# Patient Record
Sex: Male | Born: 1935 | Race: White | Hispanic: No | Marital: Married | Smoking: Former smoker
Health system: Southern US, Community
[De-identification: ages and names within clinical notes are randomized; demographics above are authoritative.]

## PROBLEM LIST (undated history)

## (undated) DIAGNOSIS — M1990 Unspecified osteoarthritis, unspecified site: Secondary | ICD-10-CM

## (undated) DIAGNOSIS — I2510 Atherosclerotic heart disease of native coronary artery without angina pectoris: Secondary | ICD-10-CM

## (undated) DIAGNOSIS — I714 Abdominal aortic aneurysm, without rupture, unspecified: Secondary | ICD-10-CM

## (undated) DIAGNOSIS — I219 Acute myocardial infarction, unspecified: Secondary | ICD-10-CM

## (undated) DIAGNOSIS — R2243 Localized swelling, mass and lump, lower limb, bilateral: Secondary | ICD-10-CM

## (undated) DIAGNOSIS — C801 Malignant (primary) neoplasm, unspecified: Secondary | ICD-10-CM

## (undated) DIAGNOSIS — M549 Dorsalgia, unspecified: Secondary | ICD-10-CM

## (undated) DIAGNOSIS — I4891 Unspecified atrial fibrillation: Secondary | ICD-10-CM

## (undated) DIAGNOSIS — I10 Essential (primary) hypertension: Secondary | ICD-10-CM

## (undated) DIAGNOSIS — K635 Polyp of colon: Secondary | ICD-10-CM

## (undated) DIAGNOSIS — E785 Hyperlipidemia, unspecified: Secondary | ICD-10-CM

## (undated) DIAGNOSIS — K5900 Constipation, unspecified: Secondary | ICD-10-CM

## (undated) DIAGNOSIS — R58 Hemorrhage, not elsewhere classified: Secondary | ICD-10-CM

## (undated) DIAGNOSIS — G8929 Other chronic pain: Secondary | ICD-10-CM

## (undated) DIAGNOSIS — G4730 Sleep apnea, unspecified: Secondary | ICD-10-CM

## (undated) HISTORY — PX: CARDIAC SURGERY: SHX584

## (undated) HISTORY — PX: IRRIGATION AND DEBRIDEMENT HEMATOMA: SHX5254

## (undated) HISTORY — PX: HEMORRHOID SURGERY: SHX153

## (undated) SURGICAL SUPPLY — 1 items: NDL ENTRY 21GA 7CM ECHOTIP (NEEDLE) IMPLANT

---

## 2006-01-30 ENCOUNTER — Other Ambulatory Visit: Payer: Self-pay

## 2006-01-30 ENCOUNTER — Emergency Department: Payer: Self-pay | Admitting: Internal Medicine

## 2006-04-12 ENCOUNTER — Other Ambulatory Visit: Payer: Self-pay

## 2006-04-12 ENCOUNTER — Emergency Department: Payer: Self-pay | Admitting: Emergency Medicine

## 2008-11-02 ENCOUNTER — Emergency Department: Payer: Self-pay | Admitting: Emergency Medicine

## 2010-03-25 ENCOUNTER — Ambulatory Visit: Payer: Self-pay | Admitting: Internal Medicine

## 2010-05-21 ENCOUNTER — Ambulatory Visit: Payer: Self-pay | Admitting: Internal Medicine

## 2010-06-07 ENCOUNTER — Ambulatory Visit: Payer: Self-pay | Admitting: Unknown Physician Specialty

## 2010-06-08 LAB — PATHOLOGY REPORT

## 2012-09-28 ENCOUNTER — Ambulatory Visit: Payer: Self-pay | Admitting: Unknown Physician Specialty

## 2013-03-17 ENCOUNTER — Ambulatory Visit: Payer: Self-pay | Admitting: Cardiology

## 2013-03-17 LAB — PROTIME-INR: Prothrombin Time: 14.7 secs (ref 11.5–14.7)

## 2014-02-24 ENCOUNTER — Ambulatory Visit: Payer: Self-pay

## 2014-03-01 ENCOUNTER — Ambulatory Visit: Payer: Self-pay

## 2014-03-10 DIAGNOSIS — I4891 Unspecified atrial fibrillation: Secondary | ICD-10-CM | POA: Insufficient documentation

## 2014-07-15 DIAGNOSIS — Z9889 Other specified postprocedural states: Secondary | ICD-10-CM | POA: Insufficient documentation

## 2014-10-19 ENCOUNTER — Observation Stay: Payer: Self-pay | Admitting: Internal Medicine

## 2014-10-19 IMAGING — CT CT EXTREM LOW W/O CM*L*
2 of 5 series · 12 of 46 positions shown, 14 images · non-contrast
Comparison: None

CLINICAL DATA: LEFT leg pain following a 7 hr car ride, no trauma,
pain with bearing weight, swelling in thigh

EXAM:
CT OF THE LOWER LEFT EXTREMITY WITHOUT CONTRAST
TECHNIQUE: Multidetector CT imaging of the LEFT hip and femur was performed
according to the standard protocol. Sagittal and coronal MPR images
reconstructed from axial data set.

[Series 5: soft tissue axials · axial · 0.49mm/px · z∈[-660,-224]mm · 9 of 274 slices shown, 11 images]
[im 28/274  soft-tissue]
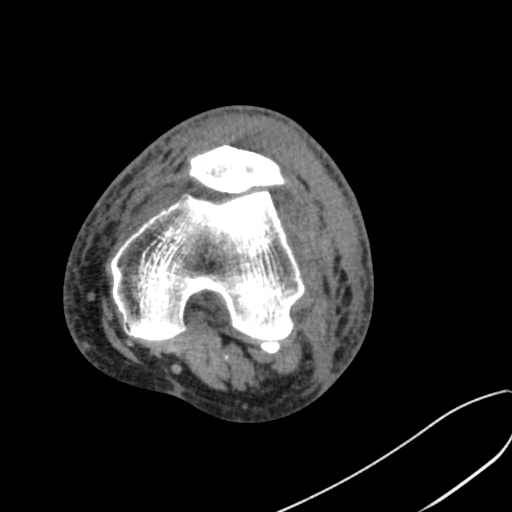
[im 28/274  bone]
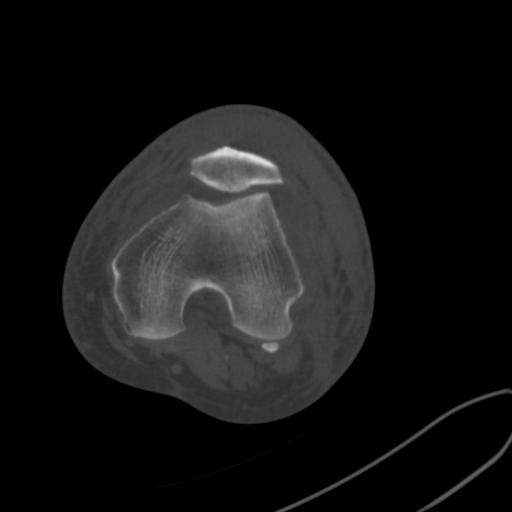
[im 55/274  soft-tissue]
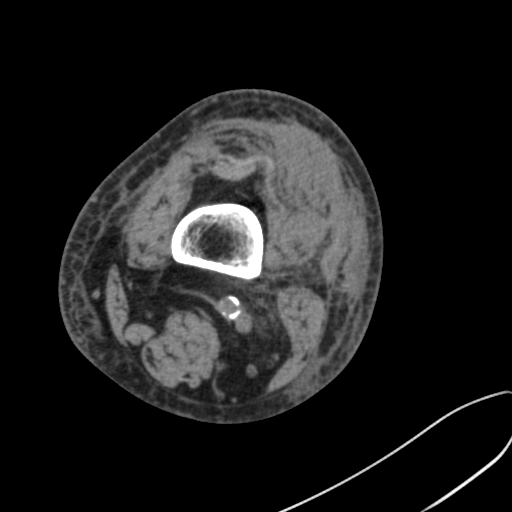
[im 82/274  soft-tissue]
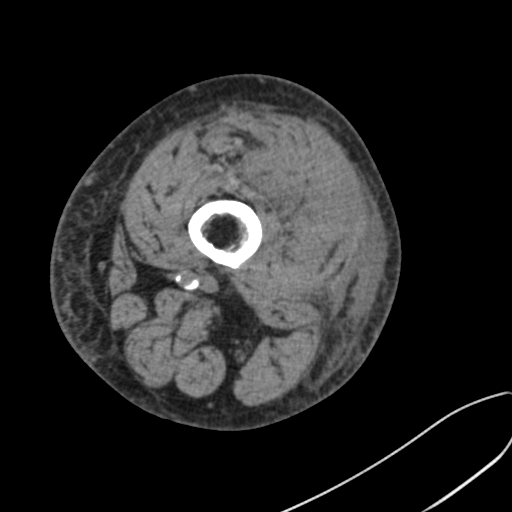
[im 110/274  soft-tissue]
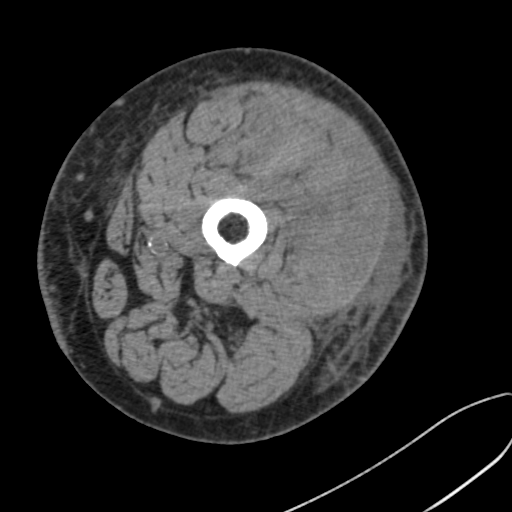
[im 137/274  soft-tissue]
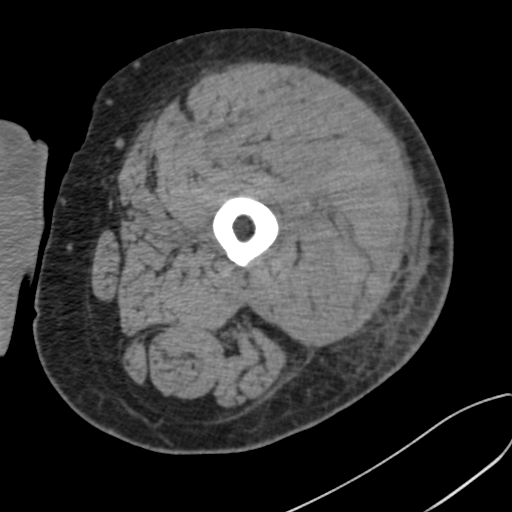
[im 164/274  soft-tissue]
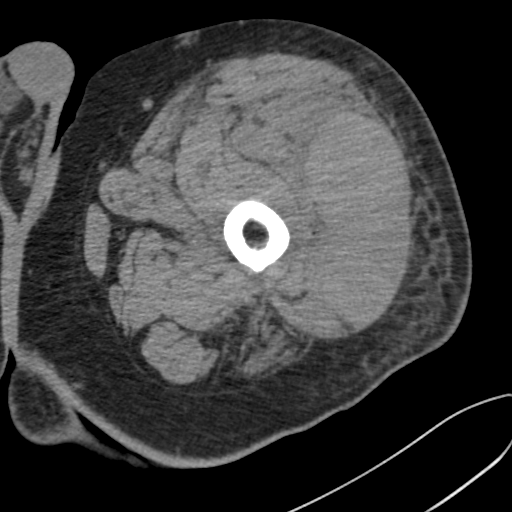
[im 192/274  soft-tissue]
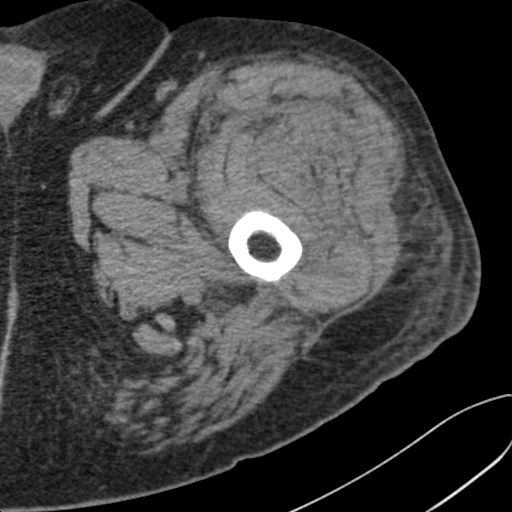
[im 219/274  soft-tissue]
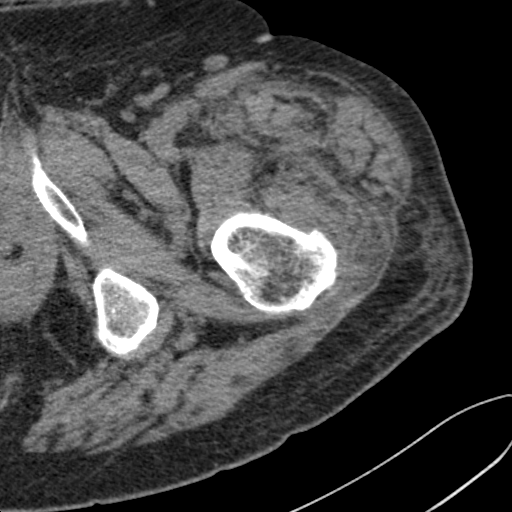
[im 246/274  soft-tissue]
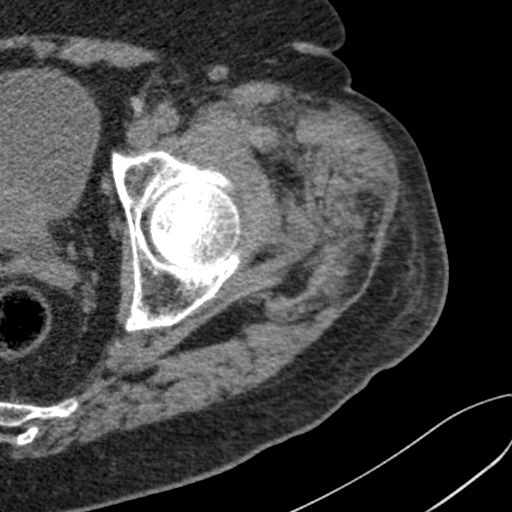
[im 246/274  bone]
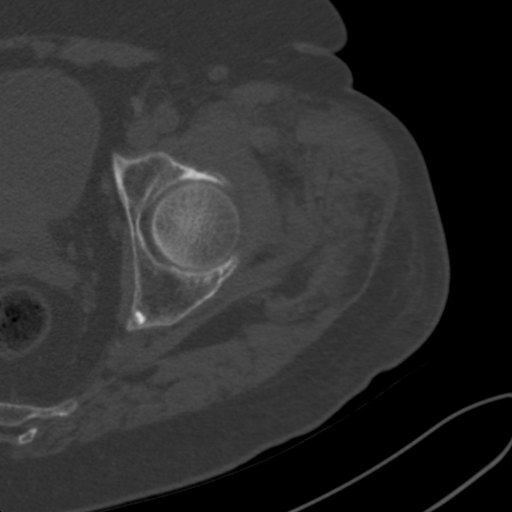

[Series 7: coronal soft tissue · coronal · 0.46mm/px · 3 of 118 slices shown]
[im 40/118  soft-tissue]
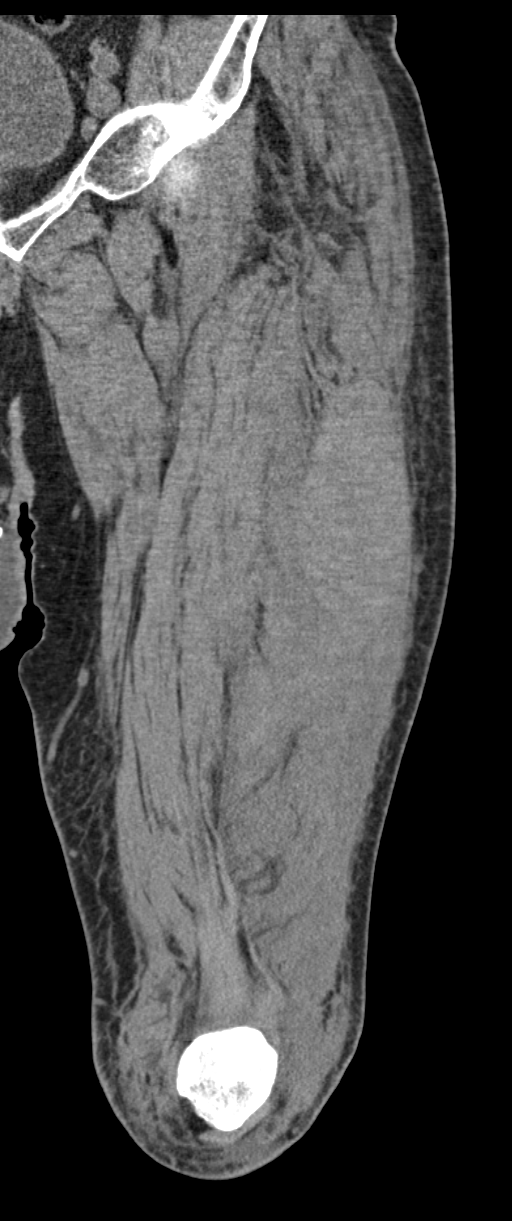
[im 53/118  soft-tissue]
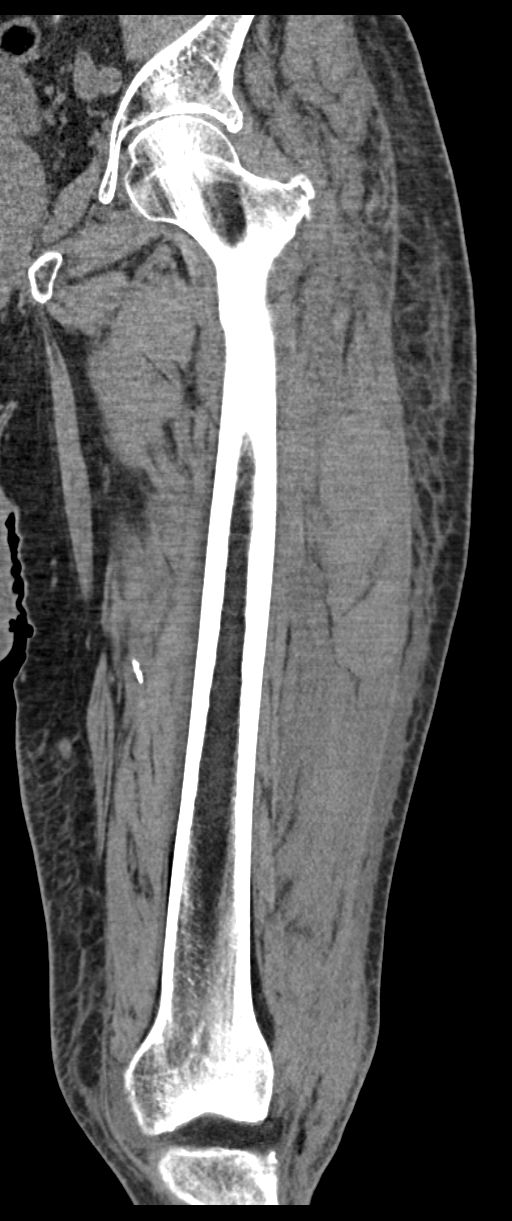
[im 66/118  soft-tissue]
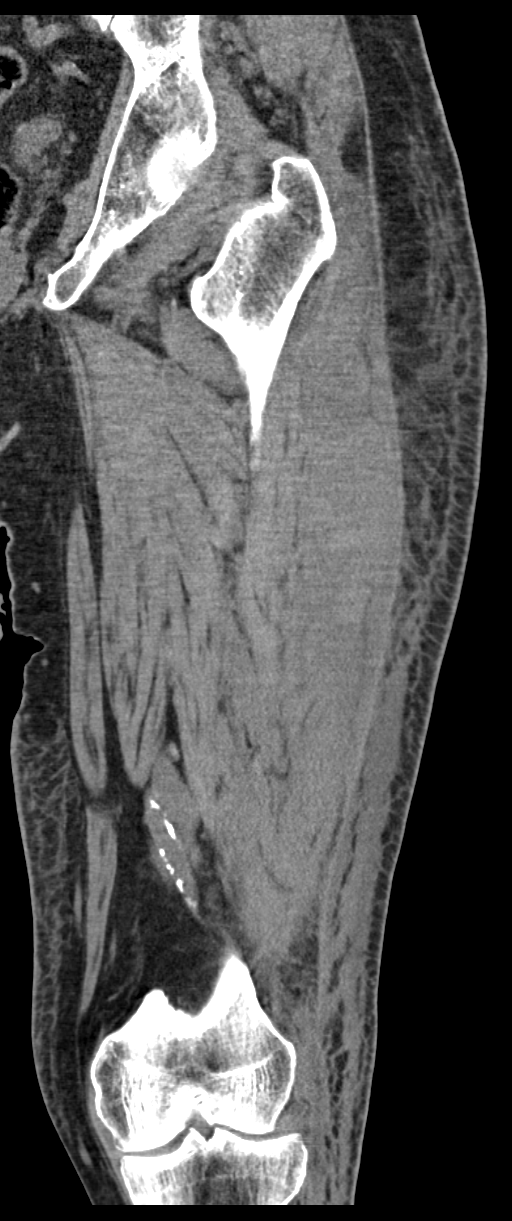

[12 of 46 positions shown; findings below may reference images not displayed]

FINDINGS: Bones appear demineralized.

Narrowing of LEFT hip joint.

Mild narrowing of knee joint spaces.

No fracture, dislocation, or bone destruction.

Extensive atherosclerotic calcifications.

High attenuation collection identified within the lateral aspect of
the LEFT quadriceps muscle, 11.3 x 4.7 x 13.6 cm in size compatible
with a large intramuscular hematoma.

Displacement of muscle bundles medially.

Scattered infiltration of intermuscular tissue planes as well as
probable additional less well defined hemorrhage extending down the
LEFT quadriceps muscle, associated with overall enlargement of the
LEFT quadriceps.

No additional mass or abnormal fluid collections.
IMPRESSION: Large intramuscular hematoma at the lateral aspect of the mid LEFT
quadriceps, 11.3 x 4.7 x 13.6 cm in size with associated
infiltration of inter and intra muscular planes within the
quadriceps, quadriceps enlargement, and overlying soft tissue edema.

Etiology of hemorrhage is uncertain; this could be related to
nonspecific trauma though underlying mass is not entirely excluded.

Clinical followup until resolution recommended at, and if there is
clinical suspicion of persistent mass consider MR follow-up.

No acute osseous abnormalities.

Mild degenerative changes of the LEFT hip and LEFT knee joints.

## 2014-10-20 LAB — BASIC METABOLIC PANEL
Anion Gap: 5 — ABNORMAL LOW (ref 7–16)
BUN: 12 mg/dL (ref 7–18)
CALCIUM: 8.6 mg/dL (ref 8.5–10.1)
CO2: 29 mmol/L (ref 21–32)
CREATININE: 0.73 mg/dL (ref 0.60–1.30)
Chloride: 107 mmol/L (ref 98–107)
EGFR (African American): >60
EGFR (Non-African Amer.): >60
Glucose: 98 mg/dL (ref 65–99)
Osmolality: 281 (ref 275–301)
Potassium: 3.9 mmol/L (ref 3.5–5.1)
Sodium: 141 mmol/L (ref 136–145)

## 2014-10-20 LAB — CBC WITH DIFFERENTIAL/PLATELET
BASOS PCT: 0.5 %
Basophil #: 0 10*3/uL (ref 0.0–0.1)
Eosinophil #: 0.3 10*3/uL (ref 0.0–0.7)
Eosinophil %: 3.7 %
HCT: 31.8 % — ABNORMAL LOW (ref 40.0–52.0)
HGB: 10.7 g/dL — AB (ref 13.0–18.0)
Lymphocyte #: 2 10*3/uL (ref 1.0–3.6)
Lymphocyte %: 21.8 %
MCH: 33.2 pg (ref 26.0–34.0)
MCHC: 33.6 g/dL (ref 32.0–36.0)
MCV: 99 fL (ref 80–100)
Monocyte #: 1 x10 3/mm (ref 0.2–1.0)
Monocyte %: 11.1 %
NEUTROS ABS: 5.8 10*3/uL (ref 1.4–6.5)
Neutrophil %: 62.9 %
Platelet: 224 10*3/uL (ref 150–440)
RBC: 3.22 10*6/uL — ABNORMAL LOW (ref 4.40–5.90)
RDW: 13.5 % (ref 11.5–14.5)
WBC: 9.2 10*3/uL (ref 3.8–10.6)

## 2014-10-20 LAB — PROTIME-INR
INR: 2.8
Prothrombin Time: 28.8 secs — ABNORMAL HIGH (ref 11.5–14.7)

## 2014-10-20 LAB — CK: CK, Total: 44 U/L (ref 39–308)

## 2014-10-21 LAB — CBC WITH DIFFERENTIAL/PLATELET
Basophil #: 0.1 10*3/uL (ref 0.0–0.1)
Basophil %: 0.5 %
EOS ABS: 0.3 10*3/uL (ref 0.0–0.7)
EOS PCT: 3.1 %
HCT: 29 % — AB (ref 40.0–52.0)
HGB: 9.6 g/dL — AB (ref 13.0–18.0)
LYMPHS PCT: 18.5 %
Lymphocyte #: 1.9 10*3/uL (ref 1.0–3.6)
MCH: 33 pg (ref 26.0–34.0)
MCHC: 33.2 g/dL (ref 32.0–36.0)
MCV: 100 fL (ref 80–100)
Monocyte #: 1.1 x10 3/mm — ABNORMAL HIGH (ref 0.2–1.0)
Monocyte %: 10.2 %
Neutrophil #: 7 10*3/uL — ABNORMAL HIGH (ref 1.4–6.5)
Neutrophil %: 67.7 %
Platelet: 221 10*3/uL (ref 150–440)
RBC: 2.92 10*6/uL — ABNORMAL LOW (ref 4.40–5.90)
RDW: 13.2 % (ref 11.5–14.5)
WBC: 10.3 10*3/uL (ref 3.8–10.6)

## 2014-10-21 LAB — BASIC METABOLIC PANEL
Anion Gap: 5 — ABNORMAL LOW (ref 7–16)
BUN: 13 mg/dL (ref 7–18)
CO2: 28 mmol/L (ref 21–32)
CREATININE: 0.64 mg/dL (ref 0.60–1.30)
Calcium, Total: 8.3 mg/dL — ABNORMAL LOW (ref 8.5–10.1)
Chloride: 107 mmol/L (ref 98–107)
EGFR (Non-African Amer.): >60
Glucose: 101 mg/dL — ABNORMAL HIGH (ref 65–99)
Osmolality: 280 (ref 275–301)
Potassium: 3.7 mmol/L (ref 3.5–5.1)
Sodium: 140 mmol/L (ref 136–145)

## 2014-11-07 DIAGNOSIS — S8010XA Contusion of unspecified lower leg, initial encounter: Secondary | ICD-10-CM | POA: Insufficient documentation

## 2014-11-08 ENCOUNTER — Encounter: Payer: Self-pay | Admitting: General Surgery

## 2014-11-11 ENCOUNTER — Encounter: Payer: Self-pay | Admitting: General Surgery

## 2014-11-29 ENCOUNTER — Encounter: Payer: Self-pay | Admitting: Surgery

## 2014-12-12 ENCOUNTER — Encounter: Payer: Self-pay | Admitting: Surgery

## 2015-01-10 ENCOUNTER — Encounter
Admit: 2015-01-10 | Disposition: A | Discharge status: Home / Self Care | Payer: Self-pay | Attending: Surgery | Admitting: Surgery

## 2015-02-09 DIAGNOSIS — I10 Essential (primary) hypertension: Secondary | ICD-10-CM | POA: Insufficient documentation

## 2015-02-09 DIAGNOSIS — E785 Hyperlipidemia, unspecified: Secondary | ICD-10-CM | POA: Insufficient documentation

## 2015-02-09 DIAGNOSIS — I714 Abdominal aortic aneurysm, without rupture, unspecified: Secondary | ICD-10-CM | POA: Insufficient documentation

## 2015-02-09 DIAGNOSIS — I2510 Atherosclerotic heart disease of native coronary artery without angina pectoris: Secondary | ICD-10-CM | POA: Insufficient documentation

## 2015-02-09 DIAGNOSIS — G4730 Sleep apnea, unspecified: Secondary | ICD-10-CM | POA: Insufficient documentation

## 2015-03-04 NOTE — Consult Note (Signed)
PATIENT NAME:  Richard Wilkins, NUNO MR#:  [id number] DATE OF BIRTH:  1935-01-19  DATE OF CONSULTATION:  10/20/2014  ATTENDING PHYSICIAN:  Adin Hector, MD  CONSULTING PHYSICIAN:  Laurene Footman, MD  REASON FOR CONSULTATION:  Left thigh hematoma.   HISTORY OF PRESENT ILLNESS:  The patient is a 80 year old who did not really have a  history of trauma. He recently underwent a long trip driving a car to [place] and back and subsequent to this developed a large amount of swelling in the thigh, a great deal of pain, and had inability to ambulate. He was seen by Dr. Caryl Comes, his regular physician, yesterday. He had a CT scan that showed extensive clot along the lateral thigh, extending into the vastus lateralis. He is additionally on Coumadin related to prior stents, as well as atrial fibrillation.   PHYSICAL EXAMINATION:  On comparison of the left thigh to the right, the left thigh is much larger. It is somewhat firm, but not tense. No evidence of compartment syndrome. There is firm swelling to the lateral aspect of the thigh approximately from the greater trochanter to just above the knee with mild effusion of the knee. He has moderate pain with range of motion. No pain with hip rolling. He is neurovascularly intact otherwise.   IMPRESSION:  Based on CT and exam is a very large hematoma.   RECOMMENDATIONS:  For incision and drainage of the hematoma. I suspect that he will additionally need a wound VAC. He will probably need this for 1 to 3 weeks to get the wound to entirely heal without residual hematoma that could be quite bothersome. The risks, benefits, and possible complications were discussed with this patient and surgery scheduled today and performed.    ____________________________ Laurene Footman, MD mjm:nb D: 10/20/2014 18:39:52 ET T: 10/20/2014 23:53:40 ET JOB#: [id number]  cc: Laurene Footman, MD, <Dictator> Laurene Footman MD ELECTRONICALLY SIGNED 10/21/2014 7:10

## 2015-03-04 NOTE — Consult Note (Signed)
Brief Consult Note: Diagnosis: left vastus lateralis hematoma.   Patient was seen by consultant.   Recommend to proceed with surgery or procedure.   Comments: paln drainage of hematoma this am with wound vac application.  Electronic Signatures: Laurene Footman (MD)  (Signed 10-Dec-15 07:16)  Authored: Brief Consult Note   Last Updated: 10-Dec-15 07:16 by Laurene Footman (MD)

## 2015-03-04 NOTE — Op Note (Signed)
PATIENT NAME:  Richard Wilkins, Richard Wilkins MR#:  [id number] DATE OF BIRTH:  10-13-1935  DATE OF PROCEDURE:  10/20/2014  PREOPERATIVE DIAGNOSIS: Left thigh intramuscular hematoma.  POSTOPERATIVE DIAGNOSIS:  Left thigh intramuscular hematoma.  PROCEDURE: Incision and drainage of hematoma, application of wound VAC.   ANESTHESIA: Local with MAC.  SURGEON: Laurene Footman, MD  DESCRIPTION OF PROCEDURE: Patient was brought to the operating room, and after appropriate patient identification and timeout procedure were carried out, the skin was prepped with alcohol and 30 mL of 0.5% Sensorcaine was infiltrated in the area of the planned incision. The hip was then prepped and draped in the usual sterile fashion and a repeat timeout procedure completed. With adequate sedation having been given, the approximately 5 cm incision was made over the lateral thigh proximal to the mid thigh. The subcutaneous tissue was spread and there was a small amount of hematoma subcutaneously. After opening the IT band, there was a large hematoma with extensive clot present. This was removed with the use of a Kelly clamp and then the pulse irrigator was used to flush out the lateral thigh, removing clot adjacent to the incision. Pressing on the thigh, additional hematoma could be removed. Next, a wound VAC was applied with foam placed in the wound. The wound measured approximately 5 cm in length, 2 cm in width, 4 cm deep. The wound VAC then had suction applied. There was no leak, and this appeared to be functioning well. The patient was then sent to the recovery room in stable condition.   ESTIMATED BLOOD LOSS: Approximately 50 mL from the surgery, with a large amount of removed that was not acute blood loss.   CONDITION: To recovery room, stable.  SPECIMEN: There is no specimen.   COMPLICATIONS: No complications.   ____________________________ Laurene Footman, MD mjm:ST D: 10/20/2014 18:37:56 ET T: 10/21/2014 04:18:39  ET JOB#: [id number]  cc: Laurene Footman, MD, <Dictator> Laurene Footman MD ELECTRONICALLY SIGNED 10/21/2014 7:10

## 2015-03-04 NOTE — H&P (Signed)
PATIENT NAME:  Richard Wilkins, Richard Wilkins MR#:  [id number] DATE OF BIRTH:  10-26-1935  DATE OF ADMISSION:  10/19/2014  OBSERVATION ADMISSION NOTE  CHIEF COMPLAINT: Left leg pain.   HISTORY OF PRESENT ILLNESS: A 80 year old male with history of coronary artery disease, hypertension, hyperlipidemia, atrial fibrillation, sleep apnea, who has been on Coumadin therapy, took a trip down to [place], about a 7 hour ride, for his brother-in-law's funeral. He developed pain in his left leg during that trip, which was worse when he bore weight on it. Once he rested and left it elevated, it was slightly better, but still painful. It was also swelling, especially in the thigh. Upon traveling back, he noticed this was much more severe, and today he is not able to bear weight on it. He describes a tearing sensation in his thigh when he tries to stand on it. He has not had chest pain, shortness of breath, fevers, chills. He gets bruising with his Coumadin, but had not noticed any significant bruising on the leg. He denies any fall, trauma.   On examination, his left thigh is noticeably larger than his right, with areas of firmness in the medial and lateral aspect of the thigh, which are tender to palpation, especially over the lateral aspect. He is admitted now with severe left leg pain and inability to ambulate. Possibilities include clot, but more likely hematoma in the thigh.   PAST MEDICAL HISTORY:  1.  Coronary artery disease with prior stenting of the RCA, followed by Dr. Isaias Cowman.  2.  Hypertension.  3.  Hyperlipidemia.  4.  History of tachybrady syndrome, with atrial fibrillation on chronic Coumadin therapy.  5.  History of sleep apnea.   ALLERGIES: No known drug allergies.   MEDICATIONS:  1.  Aspirin 81 mg p.o. daily.  2.  Buspirone 15 mg p.o. at bedtime, p.r.n. anxiety.  3.  Cardizem CD 180 mg p.o. daily.  4.  Flonase 2 sprays each nostril daily.  5.  Simvastatin 40 mg p.o. at bedtime.  6.   Tamsulosin 0.4 mg p.o. at bedtime.   SOCIAL HISTORY: Remote tobacco. No alcohol.   FAMILY HISTORY: Parkinson's.   REVIEW OF SYSTEMS: Please see HPI. No vision changes; no dysphagia; no rash. Some edema in the ankle on the left side since the leg has been more painful. The remainder of a complete review of systems is negative.   PHYSICAL EXAMINATION:  VITAL SIGNS: Weight 246, pulse 99, saturation 98% on room air. Blood pressure 120/70. BMI 36.3.  GENERAL: Obese male, in some discomfort.  EYES: Pupils round, react to light. Lids and conjunctivae unremarkable.  EARS, NOSE AND THROAT: External examination unremarkable. The oropharynx is moist without lesions.  NECK: Supple. Trachea midline. No thyromegaly.  CARDIOVASCULAR: Irregularly irregular, without murmurs, gallops or rubs.  LUNGS: Clear bilaterally, without wheeze or retractions.  ABDOMEN: Soft, nontender, positive bowel sounds. No guarding or rebound.  EXTREMITIES: Significant edema of the left thigh compared to the right, with firmness. Tenderness to palpation, without significant bruising. Old bruising is seen over the knee and the posterior thigh. Trace ankle edema. Negative Homan. Minimal edema in the right ankle.  NEUROLOGIC: Cranial nerves are intact. Unable to bear weight on the left leg. He can move this leg, but gets pain to movement of the thigh muscles.  SKIN: Diffuse bruising.  LYMPHATIC: No lymphadenopathy. No cervical or supraclavicular nodes.   IMPRESSION AND PLAN:  1.  Left leg pain and swelling. Prothrombin time, CBC, MET-C were obtained  and sent STAT in the office. Due to the inability to ambulate and the need for imaging, we will bring him in under observation status. We will start with Doppler of the leg to exclude clot; however, if this is not seen, particularly if his INR is in the normal range, we will likely need to move to CT scan to evaluate for bleeding. Aneurysmal change would be still possible, though would seem  less likely as it he does appear to have palpable pulses down into the ankle. Pain control.  2. Hypertension. Continue home blood pressure medications and follow.  3.  Atrial fibrillation. Hold the Coumadin until we see what his INR looks like and rule out bleed. Continue Cardizem, as he has tolerated this.  4.  Coronary artery disease. Holding aspirin for the same reason as holding the Coumadin. He has tolerated this dose of simvastatin and Cardizem, and we will continue it for now.    ____________________________ Adin Hector, MD bjk:MT D: 10/19/2014 17:02:27 ET T: 10/19/2014 17:16:30 ET JOB#: [id number]  cc: Adin Hector, MD, <Dictator> Ramonita Lab MD ELECTRONICALLY SIGNED 10/24/2014 8:35

## 2015-12-09 DIAGNOSIS — G4733 Obstructive sleep apnea (adult) (pediatric): Secondary | ICD-10-CM | POA: Diagnosis not present

## 2015-12-11 DIAGNOSIS — I4891 Unspecified atrial fibrillation: Secondary | ICD-10-CM | POA: Diagnosis not present

## 2016-01-05 DIAGNOSIS — I10 Essential (primary) hypertension: Secondary | ICD-10-CM | POA: Diagnosis not present

## 2016-01-05 DIAGNOSIS — I714 Abdominal aortic aneurysm, without rupture: Secondary | ICD-10-CM | POA: Diagnosis not present

## 2016-01-05 DIAGNOSIS — I499 Cardiac arrhythmia, unspecified: Secondary | ICD-10-CM | POA: Diagnosis not present

## 2016-01-05 DIAGNOSIS — I70235 Atherosclerosis of native arteries of right leg with ulceration of other part of foot: Secondary | ICD-10-CM | POA: Diagnosis not present

## 2016-01-15 DIAGNOSIS — I4891 Unspecified atrial fibrillation: Secondary | ICD-10-CM | POA: Diagnosis not present

## 2016-01-26 DIAGNOSIS — D2262 Melanocytic nevi of left upper limb, including shoulder: Secondary | ICD-10-CM | POA: Diagnosis not present

## 2016-01-26 DIAGNOSIS — Z85828 Personal history of other malignant neoplasm of skin: Secondary | ICD-10-CM | POA: Diagnosis not present

## 2016-01-26 DIAGNOSIS — L0889 Other specified local infections of the skin and subcutaneous tissue: Secondary | ICD-10-CM | POA: Diagnosis not present

## 2016-01-26 DIAGNOSIS — B078 Other viral warts: Secondary | ICD-10-CM | POA: Diagnosis not present

## 2016-01-26 DIAGNOSIS — D225 Melanocytic nevi of trunk: Secondary | ICD-10-CM | POA: Diagnosis not present

## 2016-01-26 DIAGNOSIS — D2271 Melanocytic nevi of right lower limb, including hip: Secondary | ICD-10-CM | POA: Diagnosis not present

## 2016-01-26 DIAGNOSIS — L538 Other specified erythematous conditions: Secondary | ICD-10-CM | POA: Diagnosis not present

## 2016-01-29 DIAGNOSIS — I480 Paroxysmal atrial fibrillation: Secondary | ICD-10-CM | POA: Diagnosis not present

## 2016-01-29 DIAGNOSIS — Z9889 Other specified postprocedural states: Secondary | ICD-10-CM | POA: Diagnosis not present

## 2016-01-29 DIAGNOSIS — I2510 Atherosclerotic heart disease of native coronary artery without angina pectoris: Secondary | ICD-10-CM | POA: Diagnosis not present

## 2016-01-29 DIAGNOSIS — G4733 Obstructive sleep apnea (adult) (pediatric): Secondary | ICD-10-CM | POA: Diagnosis not present

## 2016-01-29 DIAGNOSIS — I714 Abdominal aortic aneurysm, without rupture: Secondary | ICD-10-CM | POA: Diagnosis not present

## 2016-01-29 DIAGNOSIS — E7800 Pure hypercholesterolemia, unspecified: Secondary | ICD-10-CM | POA: Diagnosis not present

## 2016-01-29 DIAGNOSIS — I10 Essential (primary) hypertension: Secondary | ICD-10-CM | POA: Diagnosis not present

## 2016-02-12 DIAGNOSIS — I4891 Unspecified atrial fibrillation: Secondary | ICD-10-CM | POA: Diagnosis not present

## 2016-02-26 DIAGNOSIS — G4733 Obstructive sleep apnea (adult) (pediatric): Secondary | ICD-10-CM | POA: Diagnosis not present

## 2016-03-13 DIAGNOSIS — I4891 Unspecified atrial fibrillation: Secondary | ICD-10-CM | POA: Diagnosis not present

## 2016-04-10 DIAGNOSIS — I10 Essential (primary) hypertension: Secondary | ICD-10-CM | POA: Diagnosis not present

## 2016-04-10 DIAGNOSIS — I2510 Atherosclerotic heart disease of native coronary artery without angina pectoris: Secondary | ICD-10-CM | POA: Diagnosis not present

## 2016-04-10 DIAGNOSIS — I4891 Unspecified atrial fibrillation: Secondary | ICD-10-CM | POA: Diagnosis not present

## 2016-04-17 DIAGNOSIS — I10 Essential (primary) hypertension: Secondary | ICD-10-CM | POA: Diagnosis not present

## 2016-04-17 DIAGNOSIS — D692 Other nonthrombocytopenic purpura: Secondary | ICD-10-CM | POA: Insufficient documentation

## 2016-04-17 DIAGNOSIS — E784 Other hyperlipidemia: Secondary | ICD-10-CM | POA: Diagnosis not present

## 2016-04-17 DIAGNOSIS — I480 Paroxysmal atrial fibrillation: Secondary | ICD-10-CM | POA: Diagnosis not present

## 2016-04-17 DIAGNOSIS — I2510 Atherosclerotic heart disease of native coronary artery without angina pectoris: Secondary | ICD-10-CM | POA: Diagnosis not present

## 2016-04-17 DIAGNOSIS — G4733 Obstructive sleep apnea (adult) (pediatric): Secondary | ICD-10-CM | POA: Diagnosis not present

## 2016-04-17 DIAGNOSIS — I714 Abdominal aortic aneurysm, without rupture: Secondary | ICD-10-CM | POA: Diagnosis not present

## 2016-05-08 DIAGNOSIS — I4891 Unspecified atrial fibrillation: Secondary | ICD-10-CM | POA: Diagnosis not present

## 2016-05-29 DIAGNOSIS — R208 Other disturbances of skin sensation: Secondary | ICD-10-CM | POA: Diagnosis not present

## 2016-05-29 DIAGNOSIS — G4733 Obstructive sleep apnea (adult) (pediatric): Secondary | ICD-10-CM | POA: Diagnosis not present

## 2016-05-29 DIAGNOSIS — D485 Neoplasm of uncertain behavior of skin: Secondary | ICD-10-CM | POA: Diagnosis not present

## 2016-05-29 DIAGNOSIS — L821 Other seborrheic keratosis: Secondary | ICD-10-CM | POA: Diagnosis not present

## 2016-06-05 DIAGNOSIS — I4891 Unspecified atrial fibrillation: Secondary | ICD-10-CM | POA: Diagnosis not present

## 2016-06-12 DIAGNOSIS — M722 Plantar fascial fibromatosis: Secondary | ICD-10-CM | POA: Diagnosis not present

## 2016-06-26 DIAGNOSIS — M79672 Pain in left foot: Secondary | ICD-10-CM | POA: Diagnosis not present

## 2016-06-26 DIAGNOSIS — M722 Plantar fascial fibromatosis: Secondary | ICD-10-CM | POA: Diagnosis not present

## 2016-07-03 DIAGNOSIS — I4891 Unspecified atrial fibrillation: Secondary | ICD-10-CM | POA: Diagnosis not present

## 2016-07-05 DIAGNOSIS — I499 Cardiac arrhythmia, unspecified: Secondary | ICD-10-CM | POA: Diagnosis not present

## 2016-07-05 DIAGNOSIS — I10 Essential (primary) hypertension: Secondary | ICD-10-CM | POA: Diagnosis not present

## 2016-07-05 DIAGNOSIS — I714 Abdominal aortic aneurysm, without rupture: Secondary | ICD-10-CM | POA: Diagnosis not present

## 2016-07-05 DIAGNOSIS — I70235 Atherosclerosis of native arteries of right leg with ulceration of other part of foot: Secondary | ICD-10-CM | POA: Diagnosis not present

## 2016-07-24 DIAGNOSIS — M79672 Pain in left foot: Secondary | ICD-10-CM | POA: Diagnosis not present

## 2016-07-24 DIAGNOSIS — M722 Plantar fascial fibromatosis: Secondary | ICD-10-CM | POA: Diagnosis not present

## 2016-08-01 DIAGNOSIS — E7800 Pure hypercholesterolemia, unspecified: Secondary | ICD-10-CM | POA: Diagnosis not present

## 2016-08-01 DIAGNOSIS — I10 Essential (primary) hypertension: Secondary | ICD-10-CM | POA: Diagnosis not present

## 2016-08-01 DIAGNOSIS — G4733 Obstructive sleep apnea (adult) (pediatric): Secondary | ICD-10-CM | POA: Diagnosis not present

## 2016-08-01 DIAGNOSIS — R0602 Shortness of breath: Secondary | ICD-10-CM | POA: Diagnosis not present

## 2016-08-01 DIAGNOSIS — Z9889 Other specified postprocedural states: Secondary | ICD-10-CM | POA: Diagnosis not present

## 2016-08-01 DIAGNOSIS — I4891 Unspecified atrial fibrillation: Secondary | ICD-10-CM | POA: Diagnosis not present

## 2016-08-01 DIAGNOSIS — I480 Paroxysmal atrial fibrillation: Secondary | ICD-10-CM | POA: Diagnosis not present

## 2016-08-08 ENCOUNTER — Emergency Department: Payer: PPO

## 2016-08-08 ENCOUNTER — Encounter: Payer: Self-pay | Admitting: Emergency Medicine

## 2016-08-08 ENCOUNTER — Observation Stay
Admission: EM | Admit: 2016-08-08 | Discharge: 2016-08-09 | Disposition: A | Discharge status: Home / Self Care | Payer: PPO | Attending: Internal Medicine | Admitting: Internal Medicine

## 2016-08-08 DIAGNOSIS — Z87891 Personal history of nicotine dependence: Secondary | ICD-10-CM | POA: Diagnosis not present

## 2016-08-08 DIAGNOSIS — J449 Chronic obstructive pulmonary disease, unspecified: Secondary | ICD-10-CM | POA: Diagnosis not present

## 2016-08-08 DIAGNOSIS — Z7982 Long term (current) use of aspirin: Secondary | ICD-10-CM | POA: Diagnosis not present

## 2016-08-08 DIAGNOSIS — I2510 Atherosclerotic heart disease of native coronary artery without angina pectoris: Secondary | ICD-10-CM | POA: Diagnosis not present

## 2016-08-08 DIAGNOSIS — I10 Essential (primary) hypertension: Secondary | ICD-10-CM | POA: Diagnosis not present

## 2016-08-08 DIAGNOSIS — M549 Dorsalgia, unspecified: Secondary | ICD-10-CM | POA: Diagnosis not present

## 2016-08-08 DIAGNOSIS — Q438 Other specified congenital malformations of intestine: Secondary | ICD-10-CM | POA: Insufficient documentation

## 2016-08-08 DIAGNOSIS — K5900 Constipation, unspecified: Principal | ICD-10-CM | POA: Diagnosis present

## 2016-08-08 DIAGNOSIS — E785 Hyperlipidemia, unspecified: Secondary | ICD-10-CM | POA: Insufficient documentation

## 2016-08-08 DIAGNOSIS — Z7901 Long term (current) use of anticoagulants: Secondary | ICD-10-CM | POA: Insufficient documentation

## 2016-08-08 DIAGNOSIS — G8929 Other chronic pain: Secondary | ICD-10-CM | POA: Diagnosis not present

## 2016-08-08 DIAGNOSIS — I714 Abdominal aortic aneurysm, without rupture: Secondary | ICD-10-CM | POA: Diagnosis not present

## 2016-08-08 DIAGNOSIS — I482 Chronic atrial fibrillation: Secondary | ICD-10-CM | POA: Diagnosis not present

## 2016-08-08 HISTORY — DX: Atherosclerotic heart disease of native coronary artery without angina pectoris: I25.10

## 2016-08-08 HISTORY — DX: Hyperlipidemia, unspecified: E78.5

## 2016-08-08 HISTORY — DX: Essential (primary) hypertension: I10

## 2016-08-08 HISTORY — DX: Sleep apnea, unspecified: G47.30

## 2016-08-08 HISTORY — DX: Polyp of colon: K63.5

## 2016-08-08 HISTORY — DX: Abdominal aortic aneurysm, without rupture: I71.4

## 2016-08-08 HISTORY — DX: Other chronic pain: G89.29

## 2016-08-08 HISTORY — DX: Malignant (primary) neoplasm, unspecified: C80.1

## 2016-08-08 HISTORY — DX: Abdominal aortic aneurysm, without rupture, unspecified: I71.40

## 2016-08-08 HISTORY — DX: Hemorrhage, not elsewhere classified: R58

## 2016-08-08 HISTORY — DX: Unspecified atrial fibrillation: I48.91

## 2016-08-08 HISTORY — DX: Dorsalgia, unspecified: M54.9

## 2016-08-08 LAB — URINALYSIS COMPLETE WITH MICROSCOPIC (ARMC ONLY)
BACTERIA UA: NONE SEEN
Bilirubin Urine: NEGATIVE
Glucose, UA: NEGATIVE mg/dL
Hgb urine dipstick: NEGATIVE
Ketones, ur: NEGATIVE mg/dL
Nitrite: NEGATIVE
PROTEIN: NEGATIVE mg/dL
Specific Gravity, Urine: 1.017 (ref 1.005–1.030)
pH: 6 (ref 5.0–8.0)

## 2016-08-08 LAB — COMPREHENSIVE METABOLIC PANEL
ALT: 20 U/L (ref 17–63)
ANION GAP: 7 (ref 5–15)
AST: 21 U/L (ref 15–41)
Albumin: 4.4 g/dL (ref 3.5–5.0)
Alkaline Phosphatase: 41 U/L (ref 38–126)
BILIRUBIN TOTAL: 1.4 mg/dL — AB (ref 0.3–1.2)
BUN: 12 mg/dL (ref 6–20)
CHLORIDE: 106 mmol/L (ref 101–111)
CO2: 26 mmol/L (ref 22–32)
Calcium: 9.7 mg/dL (ref 8.9–10.3)
Creatinine, Ser: 0.62 mg/dL (ref 0.61–1.24)
Glucose, Bld: 95 mg/dL (ref 65–99)
POTASSIUM: 3.7 mmol/L (ref 3.5–5.1)
Sodium: 139 mmol/L (ref 135–145)
TOTAL PROTEIN: 7.5 g/dL (ref 6.5–8.1)

## 2016-08-08 LAB — CBC WITH DIFFERENTIAL/PLATELET
BASOS ABS: 0.1 10*3/uL (ref 0–0.1)
Basophils Relative: 1 %
EOS PCT: 3 %
Eosinophils Absolute: 0.3 10*3/uL (ref 0–0.7)
HCT: 42.8 % (ref 40.0–52.0)
Hemoglobin: 15.2 g/dL (ref 13.0–18.0)
LYMPHS PCT: 23 %
Lymphs Abs: 2.4 10*3/uL (ref 1.0–3.6)
MCH: 33.8 pg (ref 26.0–34.0)
MCHC: 35.4 g/dL (ref 32.0–36.0)
MCV: 95.4 fL (ref 80.0–100.0)
Monocytes Absolute: 0.9 10*3/uL (ref 0.2–1.0)
Monocytes Relative: 9 %
NEUTROS ABS: 6.9 10*3/uL — AB (ref 1.4–6.5)
NEUTROS PCT: 64 %
PLATELETS: 230 10*3/uL (ref 150–440)
RBC: 4.49 MIL/uL (ref 4.40–5.90)
RDW: 14.1 % (ref 11.5–14.5)
WBC: 10.6 10*3/uL (ref 3.8–10.6)

## 2016-08-08 LAB — PROTIME-INR
INR: 2.28
PROTHROMBIN TIME: 25.5 s — AB (ref 11.4–15.2)

## 2016-08-08 LAB — TROPONIN I: Troponin I: <0.03 ng/mL (ref <0.03)

## 2016-08-08 LAB — LIPASE, BLOOD: Lipase: 20 U/L (ref 11–51)

## 2016-08-08 MED ORDER — IOPAMIDOL (ISOVUE-300) INJECTION 61%
100.0000 mL | Freq: Once | INTRAVENOUS | Status: AC | PRN
  Administered 2016-08-08: 100 mL via INTRAVENOUS

## 2016-08-08 MED ORDER — IOPAMIDOL (ISOVUE-300) INJECTION 61%
30.0000 mL | Freq: Once | INTRAVENOUS | Status: AC
  Administered 2016-08-08: 30 mL via ORAL

## 2016-08-08 NOTE — ED Provider Notes (Addendum)
[facility] Emergency Department Provider Note  Time seen: 10:00 PM  I have reviewed the triage vital signs and the nursing notes.   HISTORY  Chief Complaint Constipation    HPI Richard Wilkins is a 81 y.o. male with a past medical history of hypertension, hyperlipidemia, AAA, who presents the emergency department for constipation for the past 10 days. According to the patient over the past 10 days he has not had a substantial bowel movement. He states very small bowel movements of clear mucus. He has not had any bowel movement or been able to pass gas for the past 4 days. Patient was seen by a primary care doctor [facility] clinic ordered an x-ray, they state it is suspicious for obstruction in this and the patient to the emergency department for further evaluation. Patient states intermittent abdominal pain, but denies any currently. Denies any nausea or vomiting. Denies any fever. Patient has no history of abdominal surgery. Denies any prior history of bowel obstructions.  Past Medical History:  Diagnosis Date  . Abdominal aortic aneurysm (AAA) ([place])   . Atrial fibrillation ([place])   . Chronic back pain   . Colon polyp   . Coronary artery disease   . Hemorrhage   . Hyperlipidemia   . Hypertension   . Sleep apnea     There are no active problems to display for this patient.   Past Surgical History:  Procedure Laterality Date  . CARDIAC SURGERY    . HEMORRHOID SURGERY    . IRRIGATION AND DEBRIDEMENT HEMATOMA      Prior to Admission medications   Not on File    No Known Allergies  History reviewed. No pertinent family history.  Social History Social History  Substance Use Topics  . Smoking status: Former Smoker    Quit date: 11/11/2002  . Smokeless tobacco: Never Used  . Alcohol use Yes     Comment: occasional    Review of Systems Constitutional: Negative for fever. Cardiovascular: Negative for chest pain. Respiratory: Negative for shortness  of breath. Gastrointestinal: Intermittent abdominal pain. Positive for abdominal distention. Negative for nausea or vomiting. Positive for constipation Genitourinary: Negative for dysuria. Musculoskeletal: Negative for back pain. Neurological: Negative for headache 10-point ROS otherwise negative.  ____________________________________________   PHYSICAL EXAM:  VITAL SIGNS: ED Triage Vitals  Enc Vitals Group     BP 08/08/16 1847 (!) 148/90     Pulse Rate 08/08/16 1847 74     Resp 08/08/16 1847 17     Temp 08/08/16 1847 98.1 F (36.7 C)     Temp Source 08/08/16 1847 Oral     SpO2 08/08/16 1847 96 %     Weight 08/08/16 1849 280 lb (127 kg)     Height 08/08/16 1849 5\' 10"  (1.778 m)     Head Circumference --      Peak Flow --      Pain Score 08/08/16 1906 1     Pain Loc --      Pain Edu? --      Excl. in [place]? --     Constitutional: Alert and oriented. Well appearing and in no distress. Eyes: Normal exam ENT   Head: Normocephalic and atraumatic.   Mouth/Throat: Mucous membranes are moist. Cardiovascular: Normal rate, regular rhythm. No murmur Respiratory: Normal respiratory effort without tachypnea nor retractions. Breath sounds are clear  Gastrointestinal: Moderate distention. Diffuse tympanic percussion. Nontender to palpation. No rebound or guarding. Musculoskeletal: Nontender with normal range of motion in  all extremities. Neurologic:  Normal speech and language. No gross focal neurologic deficits Skin:  Skin is warm, dry and intact.  Psychiatric: Mood and affect are normal.   ____________________________________________     RADIOLOGY  CT pending  ____________________________________________   INITIAL IMPRESSION / ASSESSMENT AND PLAN / ED COURSE  Pertinent labs & imaging results that were available during my care of the patient were reviewed by me and considered in my medical decision making (see chart for details).  The patient presents to the  emergency department for constipation 10 days, abdominal distention. Patient is tympanic percussion. We'll proceed with labs, CT scan to rule out SBO. Overall the patient appears well with normal vitals, no distress.   Labs are largely within normal limits. CT scan is pending. Patient care signed out to Dr. Owens Shark.  CT suspicious for bowel obstruction. Official radiology read is pending. I called Dr. Burt Knack who is reviewing the CT scan.   EKG reviewed and interpreted by myself shows atrial fibrillation 88 bpm, narrow QRS, normal axis, largely normal intervals with no concerning ST changes.  ____________________________________________   FINAL CLINICAL IMPRESSION(S) / ED DIAGNOSES  Constipation Obstruction   Harvest Dark, MD 08/08/16 [id number]    Harvest Dark, MD 08/08/16 2325    Harvest Dark, MD 08/08/16 2326

## 2016-08-08 NOTE — ED Triage Notes (Signed)
Patient presents to ED with wife after seeing [facility] walk in for constipation. They done x-ray of abdomen. Patient reports not having a normal bowel movement in 10 days. Patient reports that he usually goes daily and takes a colon cleanse daily. Denies any narcotic pain medications. Denies any blood in stool. Bowel sounds all 4 quadrants, non tender. Denies vomiting. Patient reports using magnesium citrate 4 days ago with some relief and thought it was cleared up but is having occasional stomach pain.

## 2016-08-09 ENCOUNTER — Encounter: Payer: Self-pay | Admitting: Internal Medicine

## 2016-08-09 DIAGNOSIS — K5660 Unspecified intestinal obstruction: Secondary | ICD-10-CM | POA: Diagnosis not present

## 2016-08-09 DIAGNOSIS — K5900 Constipation, unspecified: Secondary | ICD-10-CM | POA: Diagnosis not present

## 2016-08-09 DIAGNOSIS — K56609 Unspecified intestinal obstruction, unspecified as to partial versus complete obstruction: Secondary | ICD-10-CM | POA: Insufficient documentation

## 2016-08-09 DIAGNOSIS — I714 Abdominal aortic aneurysm, without rupture: Secondary | ICD-10-CM | POA: Diagnosis not present

## 2016-08-09 DIAGNOSIS — I10 Essential (primary) hypertension: Secondary | ICD-10-CM | POA: Diagnosis not present

## 2016-08-09 DIAGNOSIS — E785 Hyperlipidemia, unspecified: Secondary | ICD-10-CM | POA: Diagnosis not present

## 2016-08-09 LAB — BASIC METABOLIC PANEL
Anion gap: 5 (ref 5–15)
BUN: 10 mg/dL (ref 6–20)
CO2: 27 mmol/L (ref 22–32)
CREATININE: 0.56 mg/dL — AB (ref 0.61–1.24)
Calcium: 9.4 mg/dL (ref 8.9–10.3)
Chloride: 106 mmol/L (ref 101–111)
GFR calc non Af Amer: >60 mL/min (ref >60)
Glucose, Bld: 111 mg/dL — ABNORMAL HIGH (ref 65–99)
Potassium: 3.6 mmol/L (ref 3.5–5.1)
SODIUM: 138 mmol/L (ref 135–145)

## 2016-08-09 LAB — PROTIME-INR
INR: 2.12
PROTHROMBIN TIME: 24.1 s — AB (ref 11.4–15.2)

## 2016-08-09 LAB — CBC
HCT: 44.2 % (ref 40.0–52.0)
Hemoglobin: 15.1 g/dL (ref 13.0–18.0)
MCH: 33.3 pg (ref 26.0–34.0)
MCHC: 34.2 g/dL (ref 32.0–36.0)
MCV: 97.3 fL (ref 80.0–100.0)
Platelets: 223 10*3/uL (ref 150–440)
RBC: 4.54 MIL/uL (ref 4.40–5.90)
RDW: 13.7 % (ref 11.5–14.5)
WBC: 10.5 10*3/uL (ref 3.8–10.6)

## 2016-08-09 MED ORDER — ADULT MULTIVITAMIN W/MINERALS CH
1.0000 | ORAL_TABLET | Freq: Every day | ORAL | Status: DC
  Administered 2016-08-09: 1 via ORAL
  Filled 2016-08-09: qty 1

## 2016-08-09 MED ORDER — SENNOSIDES-DOCUSATE SODIUM 8.6-50 MG PO TABS: 1.0000 | ORAL_TABLET | Freq: Two times a day (BID) | ORAL | 0 refills | Status: DC

## 2016-08-09 MED ORDER — POLYETHYLENE GLYCOL 3350 17 G PO PACK: 17.0000 g | PACK | Freq: Every day | ORAL | Status: DC

## 2016-08-09 MED ORDER — WARFARIN - PHYSICIAN DOSING INPATIENT: Freq: Every day | Status: DC

## 2016-08-09 MED ORDER — ASPIRIN EC 81 MG PO TBEC
81.0000 mg | DELAYED_RELEASE_TABLET | Freq: Every day | ORAL | Status: DC
  Administered 2016-08-09: 81 mg via ORAL
  Filled 2016-08-09: qty 1

## 2016-08-09 MED ORDER — DOCUSATE SODIUM 100 MG PO CAPS
100.0000 mg | ORAL_CAPSULE | Freq: Two times a day (BID) | ORAL | Status: DC
  Administered 2016-08-09: 09:00:00 100 mg via ORAL
  Filled 2016-08-09: qty 1

## 2016-08-09 MED ORDER — BUSPIRONE HCL 5 MG PO TABS
7.5000 mg | ORAL_TABLET | Freq: Two times a day (BID) | ORAL | Status: DC
Start: 2016-08-09 — End: 2016-08-09
  Administered 2016-08-09: 09:00:00 7.5 mg via ORAL
  Filled 2016-08-09: qty 2

## 2016-08-09 MED ORDER — SIMVASTATIN 40 MG PO TABS: 40.0000 mg | ORAL_TABLET | Freq: Every day | ORAL | Status: DC

## 2016-08-09 MED ORDER — ACETAMINOPHEN 325 MG PO TABS: 650.0000 mg | ORAL_TABLET | Freq: Four times a day (QID) | ORAL | Status: DC | PRN

## 2016-08-09 MED ORDER — SENNOSIDES-DOCUSATE SODIUM 8.6-50 MG PO TABS
1.0000 | ORAL_TABLET | Freq: Two times a day (BID) | ORAL | Status: DC
  Administered 2016-08-09 (×2): 1 via ORAL
  Filled 2016-08-09 (×3): qty 1

## 2016-08-09 MED ORDER — DILTIAZEM HCL ER COATED BEADS 180 MG PO CP24
180.0000 mg | ORAL_CAPSULE | Freq: Every day | ORAL | Status: DC
  Administered 2016-08-09: 09:00:00 180 mg via ORAL
  Filled 2016-08-09: qty 1

## 2016-08-09 MED ORDER — WARFARIN SODIUM 2.5 MG PO TABS
5.0000 mg | ORAL_TABLET | Freq: Once | ORAL | Status: AC
Start: 2016-08-09 — End: 2016-08-09
  Administered 2016-08-09: 06:00:00 5 mg via ORAL
  Filled 2016-08-09: qty 2

## 2016-08-09 MED ORDER — ALFUZOSIN HCL ER 10 MG PO TB24
10.0000 mg | ORAL_TABLET | Freq: Every day | ORAL | Status: DC
Start: 2016-08-09 — End: 2016-08-09
  Administered 2016-08-09: 10 mg via ORAL
  Filled 2016-08-09: qty 1

## 2016-08-09 MED ORDER — WARFARIN SODIUM 2.5 MG PO TABS: 5.0000 mg | ORAL_TABLET | Freq: Every day | ORAL | Status: DC

## 2016-08-09 MED ORDER — SODIUM CHLORIDE 0.9% FLUSH: 3.0000 mL | INTRAVENOUS | Status: DC | PRN

## 2016-08-09 MED ORDER — MINERAL OIL RE ENEM: 1.0000 | ENEMA | Freq: Once | RECTAL | Status: DC

## 2016-08-09 MED ORDER — ACETAMINOPHEN 650 MG RE SUPP: 650.0000 mg | Freq: Four times a day (QID) | RECTAL | Status: DC | PRN

## 2016-08-09 MED ORDER — FERROUS FUMARATE 324 (106 FE) MG PO TABS
1.0000 | ORAL_TABLET | Freq: Every day | ORAL | Status: DC
  Administered 2016-08-09: 106 mg via ORAL
  Filled 2016-08-09: qty 1

## 2016-08-09 MED ORDER — SORBITOL 70 % SOLN
30.0000 mL | Freq: Every day | Status: DC | PRN
  Administered 2016-08-09: 12:00:00 30 mL via ORAL
  Filled 2016-08-09: qty 30

## 2016-08-09 MED ORDER — POLYETHYLENE GLYCOL 3350 17 G PO PACK
17.0000 g | PACK | Freq: Every day | ORAL | Status: DC
  Administered 2016-08-09 (×2): 17 g via ORAL
  Filled 2016-08-09 (×2): qty 1

## 2016-08-09 MED ORDER — ONDANSETRON HCL 4 MG PO TABS: 4.0000 mg | ORAL_TABLET | Freq: Four times a day (QID) | ORAL | Status: DC | PRN

## 2016-08-09 MED ORDER — FLEET ENEMA 7-19 GM/118ML RE ENEM: 1.0000 | ENEMA | Freq: Once | RECTAL | Status: DC | PRN

## 2016-08-09 MED ORDER — ONDANSETRON HCL 4 MG/2ML IJ SOLN: 4.0000 mg | Freq: Four times a day (QID) | INTRAMUSCULAR | Status: DC | PRN

## 2016-08-09 MED ORDER — FLUTICASONE PROPIONATE 50 MCG/ACT NA SUSP
1.0000 | Freq: Every day | NASAL | Status: DC
  Administered 2016-08-09: 09:00:00 1 via NASAL
  Filled 2016-08-09: qty 16

## 2016-08-09 MED ORDER — SORBITOL 70 % SOLN: 30.0000 mL | Freq: Every day | 0 refills | Status: DC | PRN

## 2016-08-09 MED ORDER — SODIUM CHLORIDE 0.9 % IV SOLN: 250.0000 mL | INTRAVENOUS | Status: DC | PRN

## 2016-08-09 MED ORDER — HYDROCODONE-ACETAMINOPHEN 5-325 MG PO TABS: 1.0000 | ORAL_TABLET | Freq: Four times a day (QID) | ORAL | Status: DC | PRN

## 2016-08-09 MED ORDER — SODIUM CHLORIDE 0.9% FLUSH: 3.0000 mL | Freq: Two times a day (BID) | INTRAVENOUS | Status: DC

## 2016-08-09 NOTE — Care Management Obs Status (Signed)
[place] NOTIFICATION   Patient Details  Name: Reinhard Sirak MRN: [id number] Date of Birth: [birth year]   Medicare Observation Status Notification Given:  Yes    Shelbie Ammons, RN 08/09/2016, 8:59 AM

## 2016-08-09 NOTE — Discharge Summary (Signed)
Richard Wilkins, is a 81 y.o. male  DOB 06/17/1935  MRN [id number].  Admission date:  08/08/2016  Admitting Physician  Saundra Shelling, MD  Discharge Date:  08/09/2016   Primary MD  [place], MD  Recommendations for primary care physician for things to follow:   Primary doctor in 1 week next   follow with a vascular surgeon Dr. Lucky Cowboy  in 1 week   Admission Diagnosis  constipation   Discharge Diagnosis  constipation   Active Problems:   Constipation      Past Medical History:  Diagnosis Date  . Abdominal aortic aneurysm (AAA) ([place])   . Atrial fibrillation ([place])   . Cancer ([place])   . Chronic back pain   . Colon polyp   . Coronary artery disease   . Hemorrhage   . Hyperlipidemia   . Hypertension   . Sleep apnea     Past Surgical History:  Procedure Laterality Date  . CARDIAC SURGERY    . HEMORRHOID SURGERY    . IRRIGATION AND DEBRIDEMENT HEMATOMA         History of present illness and  Hospital Course:     Kindly see H&P for history of present illness and admission details, please review complete Labs, Consult reports and Test reports for all details in brief  HPI  from the history and physical done on the day of admission 81 year old male patient with history of abdominal aortic aneurysm, chronic atrial fibrillation, hyperlipidemia comes in because of constipation, last BM was 10 days ago. Patient also had abdominal distention. Abdominal CAT scan on admission did not show any small bowel obstruction: So he is admitted to observation status for constipation.   Hospital Course  #1 severe constipation; patient is received soapsuds enema in the emergency room, admitted to hospitalist service, noted on MiraLAX, sorbitol, Colace. Patient already had 2 BMs since he came to emergency room. He feels a little  better. And eager to go home. Labs CBC, met B are within normal range. So we will go home with stool softeners advised him to take high-fiber diet  2. Chronic atrial fibrillation: Rate controlled continue Cardizem, Coumadin. #3 history of COPD: Stable. #4 history of infrarenal  abdominal aortic aneurysm: Patient abdominal aortic aneurysm is infrarenal and sizes 5 cm. I discussed with Dr. Josie Saunders  was on call and they are aware of the size of the aneurysm and they will follow the patient along  As as  Outpatient.      Discharge Condition: stable   Follow UP  Follow-up Information    BERT Briscoe Burns III, MD Follow up in 4 day(s).   Specialty:  Internal Medicine Contact information: [address] [facility] [place] [address] [phone]        DEW,JASON, MD Follow up in 1 week(s).   Specialties:  Vascular Surgery, Radiology, Interventional Cardiology Contact information: [place] [address] [phone]             Discharge Instructions  and  Discharge Medications       Medication List    TAKE these medications   acetaminophen 500 MG tablet Commonly known as:  TYLENOL Take 1,000 mg by mouth at bedtime.   alfuzosin 10 MG 24 hr tablet Commonly known as:  UROXATRAL Take 10 mg by mouth daily with breakfast.   aspirin EC 81 MG tablet Take 81 mg by mouth daily.   busPIRone 15 MG tablet Commonly known as:  BUSPAR Take 7.5-15 mg by mouth 2 (two) times daily as needed.   COLON HERBAL CLEANSER PO Take 1 capsule by mouth daily.   diltiazem 180 MG 24 hr capsule Commonly known as:  DILACOR XR Take 180 mg by mouth daily.   diltiazem 60 MG tablet Commonly known as:  CARDIZEM Take 60 mg by mouth as needed.   docusate sodium 100 MG capsule Commonly known as:  COLACE Take 100 mg by mouth 2 (two) times daily.   Ferrous Fumarate 324 (106 Fe) MG Tabs tablet Commonly known as:  HEMOCYTE - 106 mg FE Take 1 tablet by mouth  daily.   fluticasone 50 MCG/ACT nasal spray Commonly known as:  FLONASE Place 1 spray into both nostrils daily.   HYDROcodone-acetaminophen 5-325 MG tablet Commonly known as:  NORCO/VICODIN Take 1 tablet by mouth every 6 (six) hours as needed for moderate pain.   multivitamin with minerals tablet Take 1 tablet by mouth daily.   senna-docusate 8.6-50 MG tablet Commonly known as:  Senokot-S Take 1 tablet by mouth 2 (two) times daily.   simvastatin 40 MG tablet Commonly known as:  ZOCOR Take 40 mg by mouth daily at 6 PM.   sorbitol 70 % Soln Take 30 mLs by mouth daily as needed for moderate constipation.   warfarin 5 MG tablet Commonly known as:  COUMADIN Take 5 mg by mouth daily.         Diet and Activity recommendation: See Discharge Instructions above   Consults obtained - none   Major procedures and Radiology Reports - PLEASE review detailed and final reports for all details, in brief -     Ct Abdomen Pelvis W Contrast  Result Date: 08/08/2016 CLINICAL DATA:  81 year old male with constipation. EXAM: CT ABDOMEN AND PELVIS WITH CONTRAST TECHNIQUE: Multidetector CT imaging of the abdomen and pelvis was performed using the standard protocol following bolus administration of intravenous contrast. CONTRAST:  110m ISOVUE-300 IOPAMIDOL (ISOVUE-300) INJECTION 61% COMPARISON:  CT dated 03/01/2014 FINDINGS: Lower chest: The visualized lung bases are clear. There is mild cardiomegaly. There is coronary vascular calcification. No intra-abdominal free air or free fluid. Hepatobiliary: The liver is unremarkable. No biliary ductal dilatation. Layering stone or sludge noted in the gallbladder. No pericholecystic fluid. Pancreas: Unremarkable. No pancreatic ductal dilatation or surrounding inflammatory changes. Spleen: Normal in size without focal abnormality. Adrenals/Urinary Tract: Stable appearing bilateral adrenal nodules measuring up to 2.5 cm on the left most compatible with  adenoma. Stable appearing bilateral renal cysts measuring up to 5 cm in the upper pole of the right kidney. The kidneys are otherwise unremarkable. There is no hydronephrosis or nephrolithiasis on either side. The visualized ureters and urinary bladder appear unremarkable. Stomach/Bowel: There is a 2.6 cm duodenal diverticulum. There is redundancy of the colon. There is air distention of the mid and distal portion of the colon. Moderate stool noted in the proximal colon. No twisting or mechanical obstruction. There is no evidence of small-bowel obstruction. Normal appendix. Vascular/Lymphatic: There is moderate aortoiliac atherosclerotic disease. There is a 5 cm infrarenal abdominal aortic aneurysm. There is no aortic dissection. No periaortic stranding or infiltration. The origins of the celiac axis, SMA, IMA as well as the origins of the renal arteries are patent. No portal venous gas identified. There is no adenopathy. Reproductive: The prostate gland is mildly enlarged measuring 5.6 cm in transverse diameter. Other: Small fat containing umbilical hernia. Musculoskeletal: There is degenerative changes of the spine. No acute fracture. IMPRESSION: Redundant colon with  moderate amount of stool and gas. No evidence of bowel obstruction or active inflammation. Normal appendix. Cholelithiasis. Stable appearing bilateral adrenal adenomas and renal cysts. Duodenal diverticulum. **An incidental finding of potential clinical significance has been found. A 5 cm inferior all abdominal aortic aneurysm. Recommend followup by abdomen and pelvis CTA in 3-6 months, and vascular surgery referral/consultation if not already obtained. This recommendation follows ACR consensus guidelines: White Paper of the ACR Incidental Findings Committee II on Vascular Findings. J Am Coll Radiol 2013; 10:789-794.** Electronically Signed   By: Anner Crete M.D.   On: 08/08/2016 23:36    Micro Results     No results found for this or any  previous visit (from the past 240 hour(s)).     Today   Subjective:   Richard Wilkins today has no headache,no chest abdominal pain,no new weakness tingling or numbness, feels much better wants to go home today.  Objective:   Blood pressure (!) 152/75, pulse 80, temperature 98 F (36.7 C), temperature source Oral, resp. rate 20, height 5' 10"  (1.778 m), weight 125.1 kg (275 lb 14.4 oz), SpO2 95 %.  No intake or output data in the 24 hours ending 08/09/16 0846  Exam Awake Alert, Oriented x 3, No new F.N deficits, Normal affect [place].AT,PERRAL Supple Neck,No JVD, No cervical lymphadenopathy appriciated.  Symmetrical Chest wall movement, Good air movement bilaterally, CTAB RRR,No Gallops,Rubs or new Murmurs, No Parasternal Heave +ve B.Sounds, Abd Soft, Non tender, No organomegaly appriciated, No rebound -guarding or rigidity. No Cyanosis, Clubbing or edema, No new Rash or bruise  Data Review   CBC w Diff:  Lab Results  Component Value Date   WBC 10.5 08/09/2016   HGB 15.1 08/09/2016   HGB 9.6 (L) 10/21/2014   HCT 44.2 08/09/2016   HCT 29.0 (L) 10/21/2014   PLT 223 08/09/2016   PLT 221 10/21/2014   LYMPHOPCT 23 08/08/2016   LYMPHOPCT 18.5 10/21/2014   MONOPCT 9 08/08/2016   MONOPCT 10.2 10/21/2014   EOSPCT 3 08/08/2016   EOSPCT 3.1 10/21/2014   BASOPCT 1 08/08/2016   BASOPCT 0.5 10/21/2014    CMP:  Lab Results  Component Value Date   NA 138 08/09/2016   NA 140 10/21/2014   K 3.6 08/09/2016   K 3.7 10/21/2014   CL 106 08/09/2016   CL 107 10/21/2014   CO2 27 08/09/2016   CO2 28 10/21/2014   BUN 10 08/09/2016   BUN 13 10/21/2014   CREATININE 0.56 (L) 08/09/2016   CREATININE 0.64 10/21/2014   PROT 7.5 08/08/2016   ALBUMIN 4.4 08/08/2016   BILITOT 1.4 (H) 08/08/2016   ALKPHOS 41 08/08/2016   AST 21 08/08/2016   ALT 20 08/08/2016  .   Total Time in preparing paper work, data evaluation and todays exam - 54 minutes  Sanjana Folz M.D on 08/09/2016 at  8:46 AM    Note: This dictation was prepared with Dragon dictation along with smaller phrase technology. Any transcriptional errors that result from this process are unintentional.

## 2016-08-09 NOTE — H&P (Signed)
[facility] NAME: Richard Wilkins    MR#:  [id number]  DATE OF BIRTH:  [birth year]  DATE OF ADMISSION:  08/08/2016  PRIMARY CARE PHYSICIAN: Tama High III, MD   REQUESTING/REFERRING PHYSICIAN:   CHIEF COMPLAINT:   Chief Complaint  Patient presents with  . Constipation    HISTORY OF PRESENT ILLNESS: Richard Wilkins  is a 81 y.o. male with a known history of Abdominal aortic aneurysm, atrial fibrillation coronary artery disease, hyperlipidemia, hypertension, sleep apnea presented to the emergency room with constipation. Patient has on and off constipation for a long time. But for the last 10 days he has severe constipation and very scanty bowel movement when he he tried magnesium citrate at home. He has some abdominal distention and discomfort secondary to which she presented to the emergency room. No history of nausea or vomiting. No fever or chills or cough. No history of travel or sick contacts at home. Patient was worked up with CT abdomen which showed a gaseous distention of the colon on but no obstruction. Moderate amount of stool was present in the proximal colon. Hospitalist service was consulted for further care of the patient. Surgical consult was also done by ER physician and patient was seen by surgery in the emergency room or recommended colonoscopy and laxatives. Patient has seen gastroenterology doctor in the past, Dr.Elliott  and had colonoscopy within couple of times and polyps were removed.  PAST MEDICAL HISTORY:   Past Medical History:  Diagnosis Date  . Abdominal aortic aneurysm (AAA) ([place])   . Atrial fibrillation ([place])   . Cancer ([place])   . Chronic back pain   . Colon polyp   . Coronary artery disease   . Hemorrhage   . Hyperlipidemia   . Hypertension   . Sleep apnea     PAST SURGICAL HISTORY: Past Surgical History:  Procedure Laterality Date  . CARDIAC SURGERY    . HEMORRHOID SURGERY    . IRRIGATION AND  DEBRIDEMENT HEMATOMA      SOCIAL HISTORY:  Social History  Substance Use Topics  . Smoking status: Former Smoker    Quit date: 11/11/2002  . Smokeless tobacco: Never Used  . Alcohol use Yes     Comment: occasional    FAMILY HISTORY:  Family History  Problem Relation Age of Onset  . Cancer - Other Brother     DRUG ALLERGIES: No Known Allergies  REVIEW OF SYSTEMS:   CONSTITUTIONAL: No fever, fatigue or weakness.  EYES: No blurred or double vision.  EARS, NOSE, AND THROAT: No tinnitus or ear pain.  RESPIRATORY: No cough, shortness of breath, wheezing or hemoptysis.  CARDIOVASCULAR: No chest pain, orthopnea, edema.  GASTROINTESTINAL: No nausea, vomiting Has constipation. Mild abdominal discomfort noted. GENITOURINARY: No dysuria, hematuria.  ENDOCRINE: No polyuria, nocturia,  HEMATOLOGY: No anemia, easy bruising or bleeding SKIN: No rash or lesion. MUSCULOSKELETAL: No joint pain or arthritis.   NEUROLOGIC: No tingling, numbness, weakness.  PSYCHIATRY: No anxiety or depression.   MEDICATIONS AT HOME:  Prior to Admission medications   Medication Sig Start Date End Date Taking? Authorizing Provider  acetaminophen (TYLENOL) 500 MG tablet Take 1,000 mg by mouth at bedtime.   Yes Historical Provider, MD  alfuzosin (UROXATRAL) 10 MG 24 hr tablet Take 10 mg by mouth daily with breakfast.   Yes Historical Provider, MD  aspirin EC 81 MG tablet Take 81 mg by mouth daily.   Yes Historical Provider, MD  busPIRone (BUSPAR) 15 MG tablet Take 7.5-15 mg by mouth 2 (two) times daily as needed.   Yes Historical Provider, MD  diltiazem (CARDIZEM) 60 MG tablet Take 60 mg by mouth as needed.   Yes Historical Provider, MD  diltiazem (DILACOR XR) 180 MG 24 hr capsule Take 180 mg by mouth daily.   Yes Historical Provider, MD  docusate sodium (COLACE) 100 MG capsule Take 100 mg by mouth 2 (two) times daily.   Yes Historical Provider, MD  Ferrous Fumarate (HEMOCYTE - 106 MG FE) 324 (106 Fe) MG TABS  tablet Take 1 tablet by mouth daily.   Yes Historical Provider, MD  fluticasone (FLONASE) 50 MCG/ACT nasal spray Place 1 spray into both nostrils daily.   Yes Historical Provider, MD  HYDROcodone-acetaminophen (NORCO/VICODIN) 5-325 MG tablet Take 1 tablet by mouth every 6 (six) hours as needed for moderate pain.   Yes Historical Provider, MD  Misc Natural Products (COLON HERBAL CLEANSER PO) Take 1 capsule by mouth daily.   Yes Historical Provider, MD  Multiple Vitamins-Minerals (MULTIVITAMIN WITH MINERALS) tablet Take 1 tablet by mouth daily.   Yes Historical Provider, MD  simvastatin (ZOCOR) 40 MG tablet Take 40 mg by mouth daily at 6 PM.   Yes Historical Provider, MD  warfarin (COUMADIN) 5 MG tablet Take 5 mg by mouth daily.   Yes Historical Provider, MD      PHYSICAL EXAMINATION:   VITAL SIGNS: Blood pressure (!) 160/94, pulse 71, temperature 98.1 F (36.7 C), temperature source Oral, resp. rate 13, height 5\' 10"  (1.778 m), weight 127 kg (280 lb), SpO2 97 %.  GENERAL:  81 y.o.-year-old patient lying in the bed with no acute distress.  EYES: Pupils equal, round, reactive to light and accommodation. No scleral icterus. Extraocular muscles intact.  HEENT: Head atraumatic, normocephalic. Oropharynx and nasopharynx clear.  NECK:  Supple, no jugular venous distention. No thyroid enlargement, no tenderness.  LUNGS: Normal breath sounds bilaterally, no wheezing, rales,rhonchi or crepitation. No use of accessory muscles of respiration.  CARDIOVASCULAR: S1, S2 normal. No murmurs, rubs, or gallops.  ABDOMEN: Soft, nontender, mildly distended. Bowel sounds present. No organomegaly or mass.  EXTREMITIES: No pedal edema, cyanosis, or clubbing.  NEUROLOGIC: Cranial nerves II through XII are intact. Muscle strength 5/5 in all extremities. Sensation intact. Gait not checked.  PSYCHIATRIC: The patient is alert and oriented x 3.  SKIN: No obvious rash, lesion, or ulcer.   LABORATORY PANEL:    CBC  Recent Labs Lab 08/08/16 2156  WBC 10.6  HGB 15.2  HCT 42.8  PLT 230  MCV 95.4  MCH 33.8  MCHC 35.4  RDW 14.1  LYMPHSABS 2.4  MONOABS 0.9  EOSABS 0.3  BASOSABS 0.1   ------------------------------------------------------------------------------------------------------------------  Chemistries   Recent Labs Lab 08/08/16 2156  NA 139  K 3.7  CL 106  CO2 26  GLUCOSE 95  BUN 12  CREATININE 0.62  CALCIUM 9.7  AST 21  ALT 20  ALKPHOS 41  BILITOT 1.4*   ------------------------------------------------------------------------------------------------------------------ estimated creatinine clearance is 101.8 mL/min (by C-G formula based on SCr of 0.62 mg/dL). ------------------------------------------------------------------------------------------------------------------ No results for input(s): TSH, T4TOTAL, T3FREE, THYROIDAB in the last 72 hours.  Invalid input(s): FREET3   Coagulation profile  Recent Labs Lab 08/08/16 2244  INR 2.28   ------------------------------------------------------------------------------------------------------------------- No results for input(s): DDIMER in the last 72 hours. -------------------------------------------------------------------------------------------------------------------  Cardiac Enzymes  Recent Labs Lab 08/08/16 2156  TROPONINI <0.03   ------------------------------------------------------------------------------------------------------------------ Invalid input(s): POCBNP  ---------------------------------------------------------------------------------------------------------------  Urinalysis  Component Value Date/Time   COLORURINE YELLOW (A) 08/08/2016 2156   APPEARANCEUR CLEAR (A) 08/08/2016 2156   LABSPEC 1.017 08/08/2016 2156   PHURINE 6.0 08/08/2016 2156   GLUCOSEU NEGATIVE 08/08/2016 2156   HGBUR NEGATIVE 08/08/2016 2156   BILIRUBINUR NEGATIVE 08/08/2016 2156   KETONESUR NEGATIVE  08/08/2016 2156   PROTEINUR NEGATIVE 08/08/2016 2156   NITRITE NEGATIVE 08/08/2016 2156   LEUKOCYTESUR TRACE (A) 08/08/2016 2156     RADIOLOGY: Ct Abdomen Pelvis W Contrast  Result Date: 08/08/2016 CLINICAL DATA:  81 year old male with constipation. EXAM: CT ABDOMEN AND PELVIS WITH CONTRAST TECHNIQUE: Multidetector CT imaging of the abdomen and pelvis was performed using the standard protocol following bolus administration of intravenous contrast. CONTRAST:  116mL ISOVUE-300 IOPAMIDOL (ISOVUE-300) INJECTION 61% COMPARISON:  CT dated 03/01/2014 FINDINGS: Lower chest: The visualized lung bases are clear. There is mild cardiomegaly. There is coronary vascular calcification. No intra-abdominal free air or free fluid. Hepatobiliary: The liver is unremarkable. No biliary ductal dilatation. Layering stone or sludge noted in the gallbladder. No pericholecystic fluid. Pancreas: Unremarkable. No pancreatic ductal dilatation or surrounding inflammatory changes. Spleen: Normal in size without focal abnormality. Adrenals/Urinary Tract: Stable appearing bilateral adrenal nodules measuring up to 2.5 cm on the left most compatible with adenoma. Stable appearing bilateral renal cysts measuring up to 5 cm in the upper pole of the right kidney. The kidneys are otherwise unremarkable. There is no hydronephrosis or nephrolithiasis on either side. The visualized ureters and urinary bladder appear unremarkable. Stomach/Bowel: There is a 2.6 cm duodenal diverticulum. There is redundancy of the colon. There is air distention of the mid and distal portion of the colon. Moderate stool noted in the proximal colon. No twisting or mechanical obstruction. There is no evidence of small-bowel obstruction. Normal appendix. Vascular/Lymphatic: There is moderate aortoiliac atherosclerotic disease. There is a 5 cm infrarenal abdominal aortic aneurysm. There is no aortic dissection. No periaortic stranding or infiltration. The origins of the  celiac axis, SMA, IMA as well as the origins of the renal arteries are patent. No portal venous gas identified. There is no adenopathy. Reproductive: The prostate gland is mildly enlarged measuring 5.6 cm in transverse diameter. Other: Small fat containing umbilical hernia. Musculoskeletal: There is degenerative changes of the spine. No acute fracture. IMPRESSION: Redundant colon with moderate amount of stool and gas. No evidence of bowel obstruction or active inflammation. Normal appendix. Cholelithiasis. Stable appearing bilateral adrenal adenomas and renal cysts. Duodenal diverticulum. **An incidental finding of potential clinical significance has been found. A 5 cm inferior all abdominal aortic aneurysm. Recommend followup by abdomen and pelvis CTA in 3-6 months, and vascular surgery referral/consultation if not already obtained. This recommendation follows ACR consensus guidelines: White Paper of the ACR Incidental Findings Committee II on Vascular Findings. J Am Coll Radiol 2013; 10:789-794.** Electronically Signed   By: Anner Crete M.D.   On: 08/08/2016 23:36    EKG: Orders placed or performed during the hospital encounter of 08/08/16  . ED EKG  . ED EKG  . EKG 12-Lead  . EKG 12-Lead    IMPRESSION AND PLAN: 81 year old elderly male patient with history of abdominal aortic aneurysm, hypertension, hyperlipidemia, coronary artery disease, atrial fibrillation, sleep apnea presented to the emergency room with constipation and abdominal discomfort. Admitting diagnosis 1. Severe constipation 2. Abdominal aortic aneurysm 3. Hypertension 4. Coronary artery disease 5. Hyperlipidemia Treatment plan Admit patient to medical floor observation bed Stool softeners and when necessary Fleet edema for constipation Gastroenterology consultation DVT prophylaxis resume Coumadin  for anticoagulation at  home dose Follow-up INR supportive care  All the records are reviewed and case discussed with ED  provider. Management plans discussed with the patient, family and they are in agreement.  CODE STATUS:FULL Code Status History    This patient does not have a recorded code status. Please follow your organizational policy for patients in this situation.    Advance Directive Documentation   Flowsheet Row Most Recent Value  Type of Advance Directive  Healthcare Power of Attorney [Margie H Dorion]  Pre-existing out of facility DNR order (yellow form or pink MOST form)  No data  "MOST" Form in Place?  No data       TOTAL TIME TAKING CARE OF THIS PATIENT: 50 minutes.    Saundra Shelling M.D on 08/09/2016 at 3:45 AM  Between 7am to 6pm - Pager - [phone]  After 6pm go to [email or web address] - password [email or web address]  [place] [facility]  Office  [phone]  CC: Primary care physician; Adin Hector, MD

## 2016-08-09 NOTE — Care Management (Signed)
Admitted to [facility] with the diagnosis of constipation under observation status. Lives with wife, Lesleigh Noe [phone]). Last seen Dr. Caryl Comes 5 months ago, sees every 6 months. Home Health through [facility] about 1.5 years ago following Incision and drainage of hip. No skilled facility. No home oxygen. CPAP in the home x 6 years. Uses no aids for ambulation. Takes care of all basic and instrumental activities of daily living himself, drives. No falls. Good appetite. Prescriptions are filled at [facility] on [address]. Daughter-in-law will transport Discharge to home today per Dr. Nira Retort RN MSN CCM Care Management [phone]

## 2016-08-09 NOTE — Progress Notes (Signed)
Discharge instructions given and went over with patient and wife at bedside. Prescriptions given. Follow-up appointments reviewed. Patient discharged home with wife via wheelchair by nursing staff. Madlyn Frankel, RN

## 2016-08-09 NOTE — Consult Note (Signed)
Surgical Consultation  08/09/2016  Richard Wilkins is an 81 y.o. male.   CC: Bloating  HPI: This is a patient who over the last 2 weeks is noticed increased abdominal girth and poor bowel movements he is frequently constipated and is been a lifelong struggle for him but over the last 2 weeks she's only had a few bowel movements. He took magnesium citrate and enemas as well to no avail his last bowel movement was 4 days ago and there were some liquid content to it. This may have been with the aid of the fleets enema.  He denies abdominal pain has not had any abdominal pain and denies any melena or hematochezia. Of significance is the fact that he states she's had 45 colonoscopies. Most recent one was 5 years ago and he was told that he had some difficult twists in his colon and he's had polyps in the past all of which were benign. He was told he no longer needed a colonoscopy due to his age.  Also of significance is that he is anticoagulated for atrial fibrillation he has stents in place he stopped smoking in 1996.  The patient was a Freight forwarder at [employer]. He has no family history.  Past Medical History:  Diagnosis Date  . Abdominal aortic aneurysm (AAA) ([place])   . Atrial fibrillation ([place])   . Cancer ([place])   . Chronic back pain   . Colon polyp   . Coronary artery disease   . Hemorrhage   . Hyperlipidemia   . Hypertension   . Sleep apnea     Past Surgical History:  Procedure Laterality Date  . CARDIAC SURGERY    . HEMORRHOID SURGERY    . IRRIGATION AND DEBRIDEMENT HEMATOMA      History reviewed. No pertinent family history.  Social History:  reports that he quit smoking about 13 years ago. He has never used smokeless tobacco. He reports that he drinks alcohol. He reports that he does not use drugs.  Allergies: No Known Allergies  Medications reviewed.   Review of Systems:   Review of Systems  Constitutional: Positive for malaise/fatigue. Negative for chills, fever and weight  loss.  HENT: Negative.   Eyes: Negative.   Respiratory: Negative for cough, hemoptysis, sputum production, shortness of breath and wheezing.   Cardiovascular: Negative for chest pain, palpitations, orthopnea and claudication.  Gastrointestinal: Positive for constipation. Negative for abdominal pain, blood in stool, diarrhea, heartburn, melena, nausea and vomiting.  Genitourinary: Negative.   Musculoskeletal: Negative.   Skin: Negative.   Neurological: Positive for weakness.  Endo/Heme/Allergies: Negative.   Psychiatric/Behavioral: Negative.      Physical Exam:  BP (!) 161/90 (BP Location: Right Arm)   Pulse 77   Temp 98.1 F (36.7 C) (Oral)   Resp (!) 25   Ht 5' 10"  (1.778 m)   Wt 280 lb (127 kg)   SpO2 96%   BMI 40.18 kg/m   Physical Exam  Constitutional: He is oriented to person, place, and time and well-developed, well-nourished, and in no distress. No distress.  HENT:  Head: Normocephalic and atraumatic.  Eyes: Right eye exhibits no discharge. Left eye exhibits no discharge. No scleral icterus.  Neck: Normal range of motion.  Cardiovascular: Normal rate and normal heart sounds.   Pulmonary/Chest: Effort normal and breath sounds normal. No respiratory distress. He has no wheezes. He has no rales. He exhibits no tenderness.  Abdominal: Soft. He exhibits distension. There is no tenderness. There is no rebound and  no guarding.  Distended in the upper abdomen with a large pannus essentially nontender exam tympanitic  Musculoskeletal: Normal range of motion. He exhibits edema.  Lymphadenopathy:    He has no cervical adenopathy.  Neurological: He is alert and oriented to person, place, and time.  Skin: Skin is warm and dry. He is not diaphoretic.  Psychiatric: Mood and affect normal.  Vitals reviewed.     Results for orders placed or performed during the hospital encounter of 08/08/16 (from the past 48 hour(s))  Comprehensive metabolic panel     Status: Abnormal    Collection Time: 08/08/16  9:56 PM  Result Value Ref Range   Sodium 139 135 - 145 mmol/L   Potassium 3.7 3.5 - 5.1 mmol/L   Chloride 106 101 - 111 mmol/L   CO2 26 22 - 32 mmol/L   Glucose, Bld 95 65 - 99 mg/dL   BUN 12 6 - 20 mg/dL   Creatinine, Ser 0.62 0.61 - 1.24 mg/dL   Calcium 9.7 8.9 - 10.3 mg/dL   Total Protein 7.5 6.5 - 8.1 g/dL   Albumin 4.4 3.5 - 5.0 g/dL   AST 21 15 - 41 U/L   ALT 20 17 - 63 U/L   Alkaline Phosphatase 41 38 - 126 U/L   Total Bilirubin 1.4 (H) 0.3 - 1.2 mg/dL   GFR calc non Af Amer >60 >60 mL/min   GFR calc Af Amer >60 >60 mL/min    Comment: (NOTE) The eGFR has been calculated using the CKD EPI equation. This calculation has not been validated in all clinical situations. eGFR's persistently <60 mL/min signify possible Chronic Kidney Disease.    Anion gap 7 5 - 15  CBC with Differential     Status: Abnormal   Collection Time: 08/08/16  9:56 PM  Result Value Ref Range   WBC 10.6 3.8 - 10.6 K/uL   RBC 4.49 4.40 - 5.90 MIL/uL   Hemoglobin 15.2 13.0 - 18.0 g/dL   HCT 42.8 40.0 - 52.0 %   MCV 95.4 80.0 - 100.0 fL   MCH 33.8 26.0 - 34.0 pg   MCHC 35.4 32.0 - 36.0 g/dL   RDW 14.1 11.5 - 14.5 %   Platelets 230 150 - 440 K/uL   Neutrophils Relative % 64 %   Neutro Abs 6.9 (H) 1.4 - 6.5 K/uL   Lymphocytes Relative 23 %   Lymphs Abs 2.4 1.0 - 3.6 K/uL   Monocytes Relative 9 %   Monocytes Absolute 0.9 0.2 - 1.0 K/uL   Eosinophils Relative 3 %   Eosinophils Absolute 0.3 0 - 0.7 K/uL   Basophils Relative 1 %   Basophils Absolute 0.1 0 - 0.1 K/uL  Troponin I     Status: None   Collection Time: 08/08/16  9:56 PM  Result Value Ref Range   Troponin I <0.03 <0.03 ng/mL  Lipase, blood     Status: None   Collection Time: 08/08/16  9:56 PM  Result Value Ref Range   Lipase 20 11 - 51 U/L  Urinalysis complete, with microscopic     Status: Abnormal   Collection Time: 08/08/16  9:56 PM  Result Value Ref Range   Color, Urine YELLOW (A) YELLOW   APPearance  CLEAR (A) CLEAR   Glucose, UA NEGATIVE NEGATIVE mg/dL   Bilirubin Urine NEGATIVE NEGATIVE   Ketones, ur NEGATIVE NEGATIVE mg/dL   Specific Gravity, Urine 1.017 1.005 - 1.030   Hgb urine dipstick NEGATIVE NEGATIVE   pH  6.0 5.0 - 8.0   Protein, ur NEGATIVE NEGATIVE mg/dL   Nitrite NEGATIVE NEGATIVE   Leukocytes, UA TRACE (A) NEGATIVE   RBC / HPF 0-5 0 - 5 RBC/hpf   WBC, UA 0-5 0 - 5 WBC/hpf   Bacteria, UA NONE SEEN NONE SEEN   Squamous Epithelial / LPF 0-5 (A) NONE SEEN   Mucous PRESENT   Protime-INR     Status: Abnormal   Collection Time: 08/08/16 10:44 PM  Result Value Ref Range   Prothrombin Time 25.5 (H) 11.4 - 15.2 seconds   INR 2.28    Ct Abdomen Pelvis W Contrast  Result Date: 08/08/2016 CLINICAL DATA:  81 year old male with constipation. EXAM: CT ABDOMEN AND PELVIS WITH CONTRAST TECHNIQUE: Multidetector CT imaging of the abdomen and pelvis was performed using the standard protocol following bolus administration of intravenous contrast. CONTRAST:  129m ISOVUE-300 IOPAMIDOL (ISOVUE-300) INJECTION 61% COMPARISON:  CT dated 03/01/2014 FINDINGS: Lower chest: The visualized lung bases are clear. There is mild cardiomegaly. There is coronary vascular calcification. No intra-abdominal free air or free fluid. Hepatobiliary: The liver is unremarkable. No biliary ductal dilatation. Layering stone or sludge noted in the gallbladder. No pericholecystic fluid. Pancreas: Unremarkable. No pancreatic ductal dilatation or surrounding inflammatory changes. Spleen: Normal in size without focal abnormality. Adrenals/Urinary Tract: Stable appearing bilateral adrenal nodules measuring up to 2.5 cm on the left most compatible with adenoma. Stable appearing bilateral renal cysts measuring up to 5 cm in the upper pole of the right kidney. The kidneys are otherwise unremarkable. There is no hydronephrosis or nephrolithiasis on either side. The visualized ureters and urinary bladder appear unremarkable.  Stomach/Bowel: There is a 2.6 cm duodenal diverticulum. There is redundancy of the colon. There is air distention of the mid and distal portion of the colon. Moderate stool noted in the proximal colon. No twisting or mechanical obstruction. There is no evidence of small-bowel obstruction. Normal appendix. Vascular/Lymphatic: There is moderate aortoiliac atherosclerotic disease. There is a 5 cm infrarenal abdominal aortic aneurysm. There is no aortic dissection. No periaortic stranding or infiltration. The origins of the celiac axis, SMA, IMA as well as the origins of the renal arteries are patent. No portal venous gas identified. There is no adenopathy. Reproductive: The prostate gland is mildly enlarged measuring 5.6 cm in transverse diameter. Other: Small fat containing umbilical hernia. Musculoskeletal: There is degenerative changes of the spine. No acute fracture. IMPRESSION: Redundant colon with moderate amount of stool and gas. No evidence of bowel obstruction or active inflammation. Normal appendix. Cholelithiasis. Stable appearing bilateral adrenal adenomas and renal cysts. Duodenal diverticulum. **An incidental finding of potential clinical significance has been found. A 5 cm inferior all abdominal aortic aneurysm. Recommend followup by abdomen and pelvis CTA in 3-6 months, and vascular surgery referral/consultation if not already obtained. This recommendation follows ACR consensus guidelines: White Paper of the ACR Incidental Findings Committee II on Vascular Findings. J Am Coll Radiol 2013; 10:789-794.** Electronically Signed   By: AAnner CreteM.D.   On: 08/08/2016 23:36    Assessment/Plan:  This patient with a distended colon and a likely functional obstruction in the area of the sigmoid. Of interest is the fact that the patient has had multiple colonoscopies most recently 5 years ago and that suggests that this is not a colon cancer which would be typical in a person this age. He has tried  multiple methods to obtain good bowel movements and these have failed. He is not showing signs of acidosis and has  no abdominal pain and no abdominal tenderness in his knotted impending risk for perforation. With that in mind I've recommended that prime doc admit the patient and manage his anticoagulation (his INR is currently 2.9). A GI consultation will be required to workup this patient's functional obstruction. Should surgery be needed we will be happy to follow the patient while he is in the hospital and this may result in the need for a colostomy should decompression or the etiology of this not be evident with sigmoidoscopy or colonoscopy. This is discussed with the patient has wife and Dr. Owens Shark in the emergency room. Florene Glen, MD, FACS

## 2016-08-09 NOTE — Progress Notes (Signed)
CC: Constipation w redundant colon Subjective: Feeling better after enema, no abd pain, passing gas. No emesis  Objective: Vital signs in last 24 hours: Temp:  [98 F (36.7 C)-98.1 F (36.7 C)] 98 F (36.7 C) (09/29 0456) Pulse Rate:  [32-103] 80 (09/29 0456) Resp:  [13-30] 20 (09/29 0456) BP: (134-169)/(75-120) 152/75 (09/29 0456) SpO2:  [91 %-100 %] 95 % (09/29 0456) Weight:  [125.1 kg (275 lb 14.4 oz)-127 kg (280 lb)] 125.1 kg (275 lb 14.4 oz) (09/29 0456) Last BM Date: 08/09/16  Intake/Output from previous day: No intake/output data recorded. Intake/Output this shift: Total I/O In: 240 [P.O.:240] Out: -   Physical exam: Elderly male in nad Chest CTA , s1,s2, no murmurs Abd: soft, mildly distended, no peritonitis or tenderness Ext: mild pedal edema, well perfused and warm Lab Results: CBC   Recent Labs  08/08/16 2156 08/09/16 0510  WBC 10.6 10.5  HGB 15.2 15.1  HCT 42.8 44.2  PLT 230 223   BMET  Recent Labs  08/08/16 2156 08/09/16 0510  NA 139 138  K 3.7 3.6  CL 106 106  CO2 26 27  GLUCOSE 95 111*  BUN 12 10  CREATININE 0.62 0.56*  CALCIUM 9.7 9.4   PT/INR  Recent Labs  08/08/16 2244 08/09/16 0510  LABPROT 25.5* 24.1*  INR 2.28 2.12   ABG No results for input(s): PHART, HCO3 in the last 72 hours.  Invalid input(s): PCO2, PO2  Studies/Results: Ct Abdomen Pelvis W Contrast  Result Date: 08/08/2016 CLINICAL DATA:  81 year old male with constipation. EXAM: CT ABDOMEN AND PELVIS WITH CONTRAST TECHNIQUE: Multidetector CT imaging of the abdomen and pelvis was performed using the standard protocol following bolus administration of intravenous contrast. CONTRAST:  170mL ISOVUE-300 IOPAMIDOL (ISOVUE-300) INJECTION 61% COMPARISON:  CT dated 03/01/2014 FINDINGS: Lower chest: The visualized lung bases are clear. There is mild cardiomegaly. There is coronary vascular calcification. No intra-abdominal free air or free fluid. Hepatobiliary: The liver is  unremarkable. No biliary ductal dilatation. Layering stone or sludge noted in the gallbladder. No pericholecystic fluid. Pancreas: Unremarkable. No pancreatic ductal dilatation or surrounding inflammatory changes. Spleen: Normal in size without focal abnormality. Adrenals/Urinary Tract: Stable appearing bilateral adrenal nodules measuring up to 2.5 cm on the left most compatible with adenoma. Stable appearing bilateral renal cysts measuring up to 5 cm in the upper pole of the right kidney. The kidneys are otherwise unremarkable. There is no hydronephrosis or nephrolithiasis on either side. The visualized ureters and urinary bladder appear unremarkable. Stomach/Bowel: There is a 2.6 cm duodenal diverticulum. There is redundancy of the colon. There is air distention of the mid and distal portion of the colon. Moderate stool noted in the proximal colon. No twisting or mechanical obstruction. There is no evidence of small-bowel obstruction. Normal appendix. Vascular/Lymphatic: There is moderate aortoiliac atherosclerotic disease. There is a 5 cm infrarenal abdominal aortic aneurysm. There is no aortic dissection. No periaortic stranding or infiltration. The origins of the celiac axis, SMA, IMA as well as the origins of the renal arteries are patent. No portal venous gas identified. There is no adenopathy. Reproductive: The prostate gland is mildly enlarged measuring 5.6 cm in transverse diameter. Other: Small fat containing umbilical hernia. Musculoskeletal: There is degenerative changes of the spine. No acute fracture. IMPRESSION: Redundant colon with moderate amount of stool and gas. No evidence of bowel obstruction or active inflammation. Normal appendix. Cholelithiasis. Stable appearing bilateral adrenal adenomas and renal cysts. Duodenal diverticulum. **An incidental finding of potential clinical significance has  been found. A 5 cm inferior all abdominal aortic aneurysm. Recommend followup by abdomen and pelvis CTA  in 3-6 months, and vascular surgery referral/consultation if not already obtained. This recommendation follows ACR consensus guidelines: White Paper of the ACR Incidental Findings Committee II on Vascular Findings. J Am Coll Radiol 2013; 10:789-794.** Electronically Signed   By: Anner Crete M.D.   On: 08/08/2016 23:36    Anti-infectives: Anti-infectives    None      Assessment/Plan: Constipation and redundant colon, no need for surgical intervention at this time. Recommend GI f/u and medical rx. D/W pt in detail.  Caroleen Hamman, MD, [facility]  08/09/2016

## 2016-08-09 NOTE — ED Notes (Addendum)
Pt took entire enema. Instructed to hold it as long as possible and would assist him to the bathroom when he couldn't hold it any longer.

## 2016-08-09 NOTE — ED Notes (Signed)
MD at bedside. 

## 2016-08-14 DIAGNOSIS — I714 Abdominal aortic aneurysm, without rupture: Secondary | ICD-10-CM | POA: Diagnosis not present

## 2016-08-14 DIAGNOSIS — D692 Other nonthrombocytopenic purpura: Secondary | ICD-10-CM | POA: Diagnosis not present

## 2016-08-14 DIAGNOSIS — I10 Essential (primary) hypertension: Secondary | ICD-10-CM | POA: Diagnosis not present

## 2016-08-14 DIAGNOSIS — G4733 Obstructive sleep apnea (adult) (pediatric): Secondary | ICD-10-CM | POA: Diagnosis not present

## 2016-08-14 DIAGNOSIS — Z6841 Body Mass Index (BMI) 40.0 and over, adult: Secondary | ICD-10-CM | POA: Diagnosis not present

## 2016-08-14 DIAGNOSIS — I2510 Atherosclerotic heart disease of native coronary artery without angina pectoris: Secondary | ICD-10-CM | POA: Diagnosis not present

## 2016-08-14 DIAGNOSIS — K5909 Other constipation: Secondary | ICD-10-CM | POA: Insufficient documentation

## 2016-08-14 DIAGNOSIS — K612 Anorectal abscess: Secondary | ICD-10-CM | POA: Diagnosis not present

## 2016-08-14 DIAGNOSIS — I480 Paroxysmal atrial fibrillation: Secondary | ICD-10-CM | POA: Diagnosis not present

## 2016-08-14 DIAGNOSIS — Z79899 Other long term (current) drug therapy: Secondary | ICD-10-CM | POA: Diagnosis not present

## 2016-08-14 DIAGNOSIS — E7800 Pure hypercholesterolemia, unspecified: Secondary | ICD-10-CM | POA: Diagnosis not present

## 2016-08-16 ENCOUNTER — Ambulatory Visit (INDEPENDENT_AMBULATORY_CARE_PROVIDER_SITE_OTHER): Payer: PPO | Admitting: Vascular Surgery

## 2016-08-16 ENCOUNTER — Encounter (INDEPENDENT_AMBULATORY_CARE_PROVIDER_SITE_OTHER): Payer: Self-pay | Admitting: Vascular Surgery

## 2016-08-16 VITALS — BP 148/80 | HR 57 | Resp 18 | Ht 70.0 in | Wt 278.0 lb

## 2016-08-16 DIAGNOSIS — I714 Abdominal aortic aneurysm, without rupture, unspecified: Secondary | ICD-10-CM

## 2016-08-16 DIAGNOSIS — I482 Chronic atrial fibrillation, unspecified: Secondary | ICD-10-CM

## 2016-08-16 DIAGNOSIS — I10 Essential (primary) hypertension: Secondary | ICD-10-CM

## 2016-08-16 DIAGNOSIS — I2510 Atherosclerotic heart disease of native coronary artery without angina pectoris: Secondary | ICD-10-CM | POA: Diagnosis not present

## 2016-08-16 DIAGNOSIS — E785 Hyperlipidemia, unspecified: Secondary | ICD-10-CM

## 2016-08-16 DIAGNOSIS — K5900 Constipation, unspecified: Secondary | ICD-10-CM | POA: Diagnosis not present

## 2016-08-16 NOTE — Patient Instructions (Signed)
Abdominal Aortic Aneurysm Endograft Repair Abdominal aortic aneurysm endograft repair is a procedure to fix an abdominal aortic aneurysm. An aneurysm is a weakened or damaged part of an artery wall that bulges out from the normal force of blood pumping through the body. An abdominal aortic aneurysm is an aneurysm that occurs in the lower part of the aorta, the main artery of the body.  The repair procedure is often done if the aneurysm gets so large that it might burst (rupture). A ruptured aneurysm would cause bleeding inside the body that is life threatening. Before that happens, this procedure is needed to fix the condition. The procedure may also be done if the aneurysm causes symptoms such as pain in the back, abdomen, or side. In this procedure, a tube made up of fabric supported by a metal mesh (endograft or stent-graft) is placed in the weakened part of the aorta to repair the area. This procedure may take 1-3 hours.  LET YOUR HEALTH CARE PROVIDER KNOW ABOUT:  Any allergies you have.   All medicines you are taking, including vitamins, herbs, eye drops, creams, and over-the-counter medicines.  Anysteroids you are using (by mouth or as creams).   Previous problems you or members of your family have had with the use of anesthetics.   Any blood disorders or history of blood clots.   Previous surgeries you have had.   Other health problems you have.  Possibility of pregnancy, if this applies.  RISKS AND COMPLICATIONS Generally, endograft repair of an abdominal aortic aneurysm is a safe procedure. However, as with any surgical procedure, complications can occur. Possible complications include:  Leaking of blood around the endograft.  Infection.  Damage to surrounding nerves, tissues, or structures.  Displacement of the endograft away from the proper location.   Blockage of blood flow through the graft.  Blood clots.   Kidney problems.  Blockage of blood flow to the  legs (rare).  Rupture of the aorta even after successful endograft repair (rare).  BEFORE THE PROCEDURE   You may need to have blood tests, a test to check heart rhythm (electrocardiography), or a test to check blood flow (angiography) done before the day of the procedure.  Imaging tests will be done to help determine the size and location of the aneurysm. This could include ultrasonography, a CT scan, or an MRI.  Ask your health care provider about changing or stopping your regular medicines.  Do not eat or drink anything for at least 8 hours before the procedure. Ask your health care provider if it is okay to take any needed medicines with a sip of water.  Do not smoke for as long as possible before the surgery.  Make plans to have someone drive you home after your hospital stay. PROCEDURE   You will be given medicine to help you relax (sedative). You may also be given medicine to make you sleep through the procedure (general anesthetic) or medicine to numb the affected area of the body (local orregional anesthetic). Medicine may be given through an intravenous (IV) access tube that is put into one of your veins.  The groin area will be washed and shaved.   Small cuts (incisions) are usually made on both sides of the groin. Long, thin tubes (catheters) are passed through these incisions, inserted into the artery in your thigh, and moved up into the aneurysm in the aorta.   The health care provider uses live X-ray pictures to guide the endograft through the catheterto   the site of the aneurysm.  The endograft is released to seal off the aneurysm and line the aorta. It prevents blood from flowing into the aneurysm and helps prevent it from rupturing. The placement of the endograft is permanent.  X-rays are used to check the position of the endograft and confirm proper placement.  The catheters are taken out, and the incisions are closed with stitches. AFTER THE PROCEDURE    You will need to lie flat for several hours. Bending the leg that has the insertion site can cause it to bleed and swell.  You will then be encouraged to move around several times a day and to gradually increase activity.   Your blood pressure and pulse (vital signs) will be checked often.  You will receive medicines to control pain.  Certain tests may be done to check the function and location of the endograft after your procedure.  You will need to stay in the hospital for about 1-4 days.    This information is not intended to replace advice given to you by your health care provider. Make sure you discuss any questions you have with your health care provider.   Document Released: 03/16/2009 Document Revised: 06/30/2013 Document Reviewed: 03/19/2013 Elsevier Interactive Patient Education 2016 [employer]  

## 2016-08-16 NOTE — Assessment & Plan Note (Signed)
The patient has a significant enlargement of his previously known abdominal aortic aneurysm now measuring 5 cm in maximal diameter. This is a sharp uptick from his ultrasound that we perform this summer that suggested only 4.3 cm. Although the ultrasound could've underestimated the somewhat, this does represent brisk growth and a 5 cm it would be a candidate for repair anyway. He does have anatomy that is suitable for endovascular repair. I have discussed the risks and benefits in detail with the patient and he is agreeable to proceed. He is scheduled to see his cardiologist next week and that will allow [place] cardiac clearance. We will plan on scheduling him for surgery later this month.

## 2016-08-16 NOTE — Assessment & Plan Note (Signed)
To see cardiology next week

## 2016-08-16 NOTE — Assessment & Plan Note (Signed)
lipid control important in reducing the progression of atherosclerotic disease. Continue statin therapy  

## 2016-08-16 NOTE — Assessment & Plan Note (Signed)
blood pressure control important in reducing the progression of atherosclerotic disease. On appropriate oral medications.  

## 2016-08-16 NOTE — Assessment & Plan Note (Signed)
Rate controlled. On anticoagulation. 

## 2016-08-16 NOTE — Progress Notes (Signed)
MRN : [id number]  Richard Wilkins is a 81 y.o. (06-07-1935) male who presents with chief complaint of  Chief Complaint  Patient presents with  . Aneurysm    Follow up been in hospital  .  History of Present Illness: Patient returns in follow-up after recent hospitalization. He was seen earlier this summer for his abdominal aortic aneurysm which at that time by duplex measured 4.3 cm. He had some constipation and some resultant abdominal pain from that, but this has improved since discharge. He had a CT scan performed that I have independently reviewed. This demonstrates significant enlargement of his abdominal aortic aneurysm now measuring approximate 5.0 cm in maximal diameter. There is a reasonably good infrarenal neck that would allow a stent graft repair. There is no evidence of extravasation or rupture. He feels reasonably well today and does not have specific complaints.  Current Outpatient Prescriptions  Medication Sig Dispense Refill  . acetaminophen (TYLENOL) 500 MG tablet Take 1,000 mg by mouth at bedtime.    [name] Kitchen alfuzosin (UROXATRAL) 10 MG 24 hr tablet Take 10 mg by mouth daily with breakfast.    . aspirin EC 81 MG tablet Take 81 mg by mouth daily.    . busPIRone (BUSPAR) 15 MG tablet Take 7.5-15 mg by mouth 2 (two) times daily as needed.    . diltiazem (CARDIZEM) 60 MG tablet Take 60 mg by mouth as needed.    . diltiazem (DILACOR XR) 180 MG 24 hr capsule Take 180 mg by mouth daily.    [name] Kitchen docusate sodium (COLACE) 100 MG capsule Take 100 mg by mouth 2 (two) times daily.    . Ferrous Fumarate (HEMOCYTE - 106 MG FE) 324 (106 Fe) MG TABS tablet Take 1 tablet by mouth daily.    . fluticasone (FLONASE) 50 MCG/ACT nasal spray Place 1 spray into both nostrils daily.    [name] Kitchen HYDROcodone-acetaminophen (NORCO/VICODIN) 5-325 MG tablet Take 1 tablet by mouth every 6 (six) hours as needed for moderate pain.    . Misc Natural Products (COLON HERBAL CLEANSER PO) Take 1 capsule by mouth daily.    .  Multiple Vitamins-Minerals (MULTIVITAMIN WITH MINERALS) tablet Take 1 tablet by mouth daily.    [name] Kitchen senna-docusate (SENOKOT-S) 8.6-50 MG tablet Take 1 tablet by mouth 2 (two) times daily. 30 tablet 0  . simvastatin (ZOCOR) 40 MG tablet Take 40 mg by mouth daily at 6 PM.    . sorbitol 70 % SOLN Take 30 mLs by mouth daily as needed for moderate constipation. 100 mL 0  . warfarin (COUMADIN) 5 MG tablet Take 5 mg by mouth daily.     No current facility-administered medications for this visit.     Past Medical History:  Diagnosis Date  . Abdominal aortic aneurysm (AAA) ([place])   . Atrial fibrillation ([place])   . Cancer ([place])   . Chronic back pain   . Colon polyp   . Coronary artery disease   . Hemorrhage   . Hyperlipidemia   . Hypertension   . Sleep apnea     Past Surgical History:  Procedure Laterality Date  . CARDIAC SURGERY    . HEMORRHOID SURGERY    . IRRIGATION AND DEBRIDEMENT HEMATOMA      Social History Social History  Substance Use Topics  . Smoking status: Former Smoker    Quit date: 11/11/2002  . Smokeless tobacco: Never Used  . Alcohol use Yes     Comment: occasional  Married, wife accompanies him today  Family History Family History  Problem Relation Age of Onset  . Cancer - Other Brother   No family history of bleeding disorders, clotting disorders, aneurysms, or autoimmune diseases.  No Known Allergies   REVIEW OF SYSTEMS (Negative unless checked)  Constitutional: [] Weight loss  [] Fever  [] Chills Cardiac: [] Chest pain   [] Chest pressure   [x] Palpitations   [] Shortness of breath when laying flat   [] Shortness of breath at rest   [] Shortness of breath with exertion. Vascular:  [] Pain in legs with walking   [] Pain in legs at rest   [] Pain in legs when laying flat   [] Claudication   [] Pain in feet when walking  [] Pain in feet at rest  [] Pain in feet when laying flat   [] History of DVT   [] Phlebitis   [] Swelling in legs   [] Varicose veins   [] Non-healing  ulcers Pulmonary:   [] Uses home oxygen   [] Productive cough   [] Hemoptysis   [] Wheeze  [] COPD   [] Asthma Neurologic:  [] Dizziness  [] Blackouts   [] Seizures   [] History of stroke   [] History of TIA  [] Aphasia   [] Temporary blindness   [] Dysphagia   [] Weakness or numbness in arms   [] Weakness or numbness in legs Musculoskeletal:  [] Arthritis   [] Joint swelling   [] Joint pain   [] Low back pain Hematologic:  [] Easy bruising  [] Easy bleeding   [] Hypercoagulable state   [] Anemic  [] Hepatitis Gastrointestinal:  [] Blood in stool   [] Vomiting blood  [] Gastroesophageal reflux/heartburn   [] Difficulty swallowing. Genitourinary:  [] Chronic kidney disease   [] Difficult urination  [] Frequent urination  [] Burning with urination   [] Blood in urine Skin:  [] Rashes   [] Ulcers   [] Wounds Psychological:  [] History of anxiety   []  History of major depression.  Physical Examination  Vitals:   08/16/16 0918  BP: (!) 148/80  Pulse: (!) 57  Resp: 18  Weight: 278 lb (126.1 kg)  Height: 5\' 10"  (1.778 m)   Body mass index is 39.89 kg/m. Gen:  WD/WN, NAD Head: [place]/AT, No temporalis wasting. Ear/Nose/Throat: Hearing grossly intact, nares w/o erythema or drainage, trachea midline Eyes: PERRLA, EOMI. Sclera non-icteric Neck: Supple, no nuchal rigidity.  No JVD.  Pulmonary:  Good air movement, equal and clear to auscultation bilaterally.  Cardiac: Irregularly irregular Vascular:  Vessel Right Left  Radial Palpable Palpable  Ulnar Palpable Palpable  Brachial Palpable Palpable  Carotid Palpable, without bruit Palpable, without bruit  Aorta Not palpable N/A  Femoral Palpable Palpable  Popliteal Palpable Palpable  PT Not Palpable 1+, Palpable  DP Palpable Palpable   Gastrointestinal: soft, non-tender/non-distended. No guarding/reflex. Aorta not easily palpable due to body habitus Musculoskeletal: M/S 5/5 throughout.  No deformity or atrophy. 1+ bilateral lower extremity edema. Neurologic: CN 2-12 intact. Pain  and light touch intact in extremities.  Symmetrical.  Speech is fluent. Motor exam as listed above. Psychiatric: Judgment intact, Mood & affect appropriate for pt's clinical situation. Dermatologic: No rashes or ulcers noted.  No cellulitis or open wounds. Lymph : No Cervical, Axillary, or Inguinal lymphadenopathy.     CBC Lab Results  Component Value Date   WBC 10.5 08/09/2016   HGB 15.1 08/09/2016   HCT 44.2 08/09/2016   MCV 97.3 08/09/2016   PLT 223 08/09/2016    BMET    Component Value Date/Time   NA 138 08/09/2016 0510   NA 140 10/21/2014 0535   K 3.6 08/09/2016 0510   K 3.7 10/21/2014 0535   CL 106 08/09/2016 0510   CL  107 10/21/2014 0535   CO2 27 08/09/2016 0510   CO2 28 10/21/2014 0535   GLUCOSE 111 (H) 08/09/2016 0510   GLUCOSE 101 (H) 10/21/2014 0535   BUN 10 08/09/2016 0510   BUN 13 10/21/2014 0535   CREATININE 0.56 (L) 08/09/2016 0510   CREATININE 0.64 10/21/2014 0535   CALCIUM 9.4 08/09/2016 0510   CALCIUM 8.3 (L) 10/21/2014 0535   GFRNONAA >60 08/09/2016 0510   GFRNONAA >60 10/21/2014 0535   GFRAA >60 08/09/2016 0510   GFRAA >60 10/21/2014 0535   Estimated Creatinine Clearance: 101.4 mL/min (by C-G formula based on SCr of 0.56 mg/dL (L)).  COAG Lab Results  Component Value Date   INR 2.12 08/09/2016   INR 2.28 08/08/2016   INR 2.8 10/20/2014    Radiology Ct Abdomen Pelvis W Contrast  Result Date: 08/08/2016 CLINICAL DATA:  80 year old male with constipation. EXAM: CT ABDOMEN AND PELVIS WITH CONTRAST TECHNIQUE: Multidetector CT imaging of the abdomen and pelvis was performed using the standard protocol following bolus administration of intravenous contrast. CONTRAST:  130mL ISOVUE-300 IOPAMIDOL (ISOVUE-300) INJECTION 61% COMPARISON:  CT dated 03/01/2014 FINDINGS: Lower chest: The visualized lung bases are clear. There is mild cardiomegaly. There is coronary vascular calcification. No intra-abdominal free air or free fluid. Hepatobiliary: The liver  is unremarkable. No biliary ductal dilatation. Layering stone or sludge noted in the gallbladder. No pericholecystic fluid. Pancreas: Unremarkable. No pancreatic ductal dilatation or surrounding inflammatory changes. Spleen: Normal in size without focal abnormality. Adrenals/Urinary Tract: Stable appearing bilateral adrenal nodules measuring up to 2.5 cm on the left most compatible with adenoma. Stable appearing bilateral renal cysts measuring up to 5 cm in the upper pole of the right kidney. The kidneys are otherwise unremarkable. There is no hydronephrosis or nephrolithiasis on either side. The visualized ureters and urinary bladder appear unremarkable. Stomach/Bowel: There is a 2.6 cm duodenal diverticulum. There is redundancy of the colon. There is air distention of the mid and distal portion of the colon. Moderate stool noted in the proximal colon. No twisting or mechanical obstruction. There is no evidence of small-bowel obstruction. Normal appendix. Vascular/Lymphatic: There is moderate aortoiliac atherosclerotic disease. There is a 5 cm infrarenal abdominal aortic aneurysm. There is no aortic dissection. No periaortic stranding or infiltration. The origins of the celiac axis, SMA, IMA as well as the origins of the renal arteries are patent. No portal venous gas identified. There is no adenopathy. Reproductive: The prostate gland is mildly enlarged measuring 5.6 cm in transverse diameter. Other: Small fat containing umbilical hernia. Musculoskeletal: There is degenerative changes of the spine. No acute fracture. IMPRESSION: Redundant colon with moderate amount of stool and gas. No evidence of bowel obstruction or active inflammation. Normal appendix. Cholelithiasis. Stable appearing bilateral adrenal adenomas and renal cysts. Duodenal diverticulum. **An incidental finding of potential clinical significance has been found. A 5 cm inferior all abdominal aortic aneurysm. Recommend followup by abdomen and pelvis  CTA in 3-6 months, and vascular surgery referral/consultation if not already obtained. This recommendation follows ACR consensus guidelines: White Paper of the ACR Incidental Findings Committee II on Vascular Findings. J Am Coll Radiol 2013; 10:789-794.** Electronically Signed   By: Anner Crete M.D.   On: 08/08/2016 23:36     Assessment/Plan Atrial fibrillation ([place]) Rate controlled.  On anticoagulation  Chronic coronary artery disease To see cardiology next week  Hypertension blood pressure control important in reducing the progression of atherosclerotic disease. On appropriate oral medications.   Hyperlipidemia lipid control important in reducing  the progression of atherosclerotic disease. Continue statin therapy  Abdominal aortic aneurysm ([place]) The patient has a significant enlargement of his previously known abdominal aortic aneurysm now measuring 5 cm in maximal diameter. This is a sharp uptick from his ultrasound that we perform this summer that suggested only 4.3 cm. Although the ultrasound could've underestimated the somewhat, this does represent brisk growth and a 5 cm it would be a candidate for repair anyway. He does have anatomy that is suitable for endovascular repair. I have discussed the risks and benefits in detail with the patient and he is agreeable to proceed. He is scheduled to see his cardiologist next week and that will allow [place] cardiac clearance. We will plan on scheduling him for surgery later this month.    Leotis Pain, MD  08/16/2016 10:12 AM    This note was created with Dragon medical transcription system.  Any errors from dictation are purely unintentional

## 2016-08-19 DIAGNOSIS — I4891 Unspecified atrial fibrillation: Secondary | ICD-10-CM | POA: Diagnosis not present

## 2016-08-20 DIAGNOSIS — K5909 Other constipation: Secondary | ICD-10-CM | POA: Diagnosis not present

## 2016-08-21 DIAGNOSIS — R0602 Shortness of breath: Secondary | ICD-10-CM | POA: Diagnosis not present

## 2016-08-28 ENCOUNTER — Telehealth (INDEPENDENT_AMBULATORY_CARE_PROVIDER_SITE_OTHER): Payer: Self-pay | Admitting: Vascular Surgery

## 2016-08-28 NOTE — Telephone Encounter (Signed)
Spoke with patient's wife Lesleigh Noe and let her know I have not received a clearance from [facility] as of yet and she stated she would call them to find out what was going on.

## 2016-08-28 NOTE — Telephone Encounter (Signed)
Patient's wife called and is wanting to know if we have received cardiac clearance yet.

## 2016-08-29 ENCOUNTER — Telehealth (INDEPENDENT_AMBULATORY_CARE_PROVIDER_SITE_OTHER): Payer: Self-pay

## 2016-08-29 NOTE — Telephone Encounter (Signed)
Patient's wife called to find out if we had received the cardiac clearance for the patient to have surgery and I informed her that I had not received it. She stated that she had spoken with them yesterday and they were sending it over, I let her know that sometimes it takes a little longer for faxes to come through.

## 2016-08-30 ENCOUNTER — Encounter (INDEPENDENT_AMBULATORY_CARE_PROVIDER_SITE_OTHER): Payer: Self-pay

## 2016-09-02 DIAGNOSIS — G4733 Obstructive sleep apnea (adult) (pediatric): Secondary | ICD-10-CM | POA: Diagnosis not present

## 2016-09-16 ENCOUNTER — Other Ambulatory Visit (INDEPENDENT_AMBULATORY_CARE_PROVIDER_SITE_OTHER): Payer: Self-pay | Admitting: Vascular Surgery

## 2016-09-18 ENCOUNTER — Encounter
Admission: RE | Admit: 2016-09-18 | Discharge: 2016-09-18 | Disposition: A | Discharge status: Home / Self Care | Payer: PPO | Source: Ambulatory Visit | Attending: Vascular Surgery | Admitting: Vascular Surgery

## 2016-09-18 DIAGNOSIS — Z01818 Encounter for other preprocedural examination: Secondary | ICD-10-CM | POA: Insufficient documentation

## 2016-09-18 DIAGNOSIS — I714 Abdominal aortic aneurysm, without rupture: Secondary | ICD-10-CM | POA: Diagnosis not present

## 2016-09-18 DIAGNOSIS — I4891 Unspecified atrial fibrillation: Secondary | ICD-10-CM | POA: Diagnosis not present

## 2016-09-18 HISTORY — DX: Hyperlipidemia, unspecified: E78.5

## 2016-09-18 HISTORY — DX: Acute myocardial infarction, unspecified: I21.9

## 2016-09-18 HISTORY — DX: Constipation, unspecified: K59.00

## 2016-09-18 LAB — CREATININE, SERUM: Creatinine, Ser: 0.53 mg/dL — ABNORMAL LOW (ref 0.61–1.24)

## 2016-09-18 LAB — TYPE AND SCREEN
ABO/RH(D): A NEG
Antibody Screen: NEGATIVE

## 2016-09-18 LAB — SURGICAL PCR SCREEN
MRSA, PCR: NEGATIVE
STAPHYLOCOCCUS AUREUS: NEGATIVE

## 2016-09-18 LAB — BUN: BUN: 11 mg/dL (ref 6–20)

## 2016-09-18 NOTE — Patient Instructions (Addendum)
  Your procedure is scheduled on: 09/25/16 Wed. Report to [facility] 1st floor medical mall To find out your arrival time please call [phone] between 1PM - 3PM on 09/24/16 Tues Remember: Instructions that are not followed completely may result in serious medical risk, up to and including death, or upon the discretion of your surgeon and anesthesiologist your surgery may need to be rescheduled.    _x___ 1. Do not eat food or drink liquids after midnight. No gum chewing or hard candies.     __x__ 2. No Alcohol for 24 hours before or after surgery.   __x__3. No Smoking for 24 prior to surgery.   ____  4. Bring all medications with you on the day of surgery if instructed.    __x__ 5. Notify your doctor if there is any change in your medical condition     (cold, fever, infections).     Do not wear jewelry, make-up, hairpins, clips or nail polish.  Do not wear lotions, powders, or perfumes. You may wear deodorant.  Do not shave 48 hours prior to surgery. Men may shave face and neck.  Do not bring valuables to the hospital.    [facility] is not responsible for any belongings or valuables.               Contacts, dentures or bridgework may not be worn into surgery.  Leave your suitcase in the car. After surgery it may be brought to your room.  For patients admitted to the hospital, discharge time is determined by your treatment team.   Patients discharged the day of surgery will not be allowed to drive home.    Please read over the following fact sheets that you were given:   [facility] Preparing for Surgery and or MRSA Information   _x___ Take these medicines the morning of surgery with A SIP OF WATER:    1. linaclotide (LINZESS) 145 MCG CAPS capsule  2.busPIRone (BUSPAR) 15 MG tablet  3.  4.  5.  6.  ____Fleets enema or Magnesium Citrate as directed.   _x___ Use CHG Soap or sage wipes as directed on instruction sheet   ____ Use inhalers on the day of surgery and  bring to hospital day of surgery  ____ Stop metformin 2 days prior to surgery    ____ Take 1/2 of usual insulin dose the night before surgery and none on the morning of           surgery.   _x___ Stop aspirin or coumadin, or plavix  Stop coumadin 3 days before surgery  x__ Stop Anti-inflammatories such as Advil, Aleve, Ibuprofen, Motrin, Naproxen,          Naprosyn, Goodies powders or aspirin products. Ok to take Tylenol.   ____ Stop supplements until after surgery.    ____ Bring C-Pap to the hospital.

## 2016-09-18 NOTE — Pre-Procedure Instructions (Signed)
Value Ref Range  LV Ejection Fraction (%) 55   Aortic Valve Regurgitation Grade mild   Aortic Valve Stenosis Grade none   Aortic Valve Max Velocity (m/s) 1.8 m/sec   Aortic Valve Stenosis Mean Gradient (mmHg) 7.0 mmHg   Mitral Valve Regurgitation Grade moderate   Mitral Valve Stenosis Grade none   Tricuspid Valve Regurgitation Grade mild   Tricuspid Valve Regurgitation Max Velocity (m/s) 2.6 m/sec   Right Ventricle Systolic Pressure (mmHg) [id number] mmHg   LV End Diastolic Diameter (cm) 5.7 cm  LV End Systolic Diameter (cm) 4.6 cm  LV Septum Wall Thickness (cm) 1.1 cm  LV Posterior Wall Thickness (cm) 1.1 cm  Left Atrium Diameter (cm) 6.0 cm  Result Narrative   CARDIOLOGY DEPARTMENT AAVEN, BIGGIO [id number] [facility] Acct #: [id number] [address] [place], [place] [address] Date: 08/21/2016 03:30 PM  Adult Male Age: 81 yrs  ECHOCARDIOGRAM REPORT Outpatient  KC::KCWC  STUDY:CHEST WALL TAPE:0000:00: 0:00:00MD1:PARASCHOS, ALEXANDER ECHO:Yes DOPPLER:YesFILE:0000-000-000BP: 141/79 mmHg  COLOR:YesCONTRAST:No MACHINE:PhilipsHeight: 70 in  RV BIOPSY:No 3D:NoSOUND QLTY:Moderate Weight: 276 lb MEDIUM:NoneBSA: 2.4 m2  ___________________________________________________________________________________________  HISTORY:SOB REASON:Assess, LV function INDICATION:R06.02 Shortness of breath ___________________________________________________________________________________________  ECHOCARDIOGRAPHIC  MEASUREMENTS 2D DIMENSIONS AORTA ValuesNormal RangeMAIN PAValuesNormal Range Annulus:nm* [2.3 - 2.9]PA Main:nm* [1.5 - 2.1] Aorta Sin:nm* [3.1 - 3.7] RIGHT VENTRICLE ST Junction:nm* [2.6 - 3.2]RV Base:5.2 cm[ < 4.2] Asc.Aorta:nm* [2.6 - 3.4] RV Mid:nm* [ < 3.5]  LEFT VENTRICLERV Length:nm* [ < 8.6] LVIDd:5.7 cm[4.2 - 5.9] INFERIOR VENA CAVA LVIDs:4.6 cmMax. IVC:nm* [ <= 2.1]  FS:20.0 %[> 25]Min. IVC:nm* SWT:1.1 cm[0.6 - 1.0] ------------------ PWT:1.1 cm[0.6 - 1.0] nm* - not measured  LEFT ATRIUM LA Diam:6.0 cm[3.0 - 4.0] LA A4C Area:nm* [ < 20] LA Volume:nm* Roice.Felt - 58] ___________________________________________________________________________________________  ECHOCARDIOGRAPHIC DESCRIPTIONS  AORTIC ROOT Size:Normal Dissection:INDETERM FOR DISSECTION  AORTIC VALVE Leaflets:Tricuspid Morphology:MILDLY THICKENED Mobility:Fully mobile  LEFT VENTRICLE Size:NormalAnterior:Normal  Contraction:Normal Lateral:Normal Closest EF:>55% (Estimated)Septal:Normal  LV Masses:No Masses Apical:Normal  EB:[id number] Posterior:Normal Dias.FxClass:N/A  MITRAL VALVE Leaflets:NormalMobility:Fully mobile Morphology:Normal  LEFT ATRIUM Size:MODERATELY ENLARGEDLA Masses:No masses  IA Septum:Normal IAS  MAIN PA Size:Normal  PULMONIC  VALVE Morphology:NormalMobility:Fully mobile  RIGHT VENTRICLE Size:MILDLY ENLARGEDFree Wall:Normal  Contraction:Normal RV Masses:No Masses  TRICUSPID VALVE Leaflets:NormalMobility:Fully mobile Morphology:Normal  RIGHT ATRIUM Size:MODERATELY ENLARGED RA Other:None  RA Mass:No masses  PERICARDIUM  Fluid:No effusion  INFERIOR VENACAVA Size:Normal Normal respiratory collapse  ___________________________________________________________________  DOPPLER ECHO and OTHER SPECIAL PROCEDURES  Aortic:MILD AR No AS 184.0 cm/sec peak vel 13.5 mmHg peak grad 7.0 mmHg mean grad3.1 cm^2 by DOPPLER   Mitral:MODERATE MR No MS 4.3 cm^2 by DOPPLER MV Inflow E Vel=93.8 cm/sec MV Annulus E'Vel=nm* E/E'Ratio=nm*  Tricuspid:MILD TR No TS 259.6 cm/sec peak TR vel37.1 mmHg peak RV pressure  Pulmonary:TRIVIAL PRNo PS    ___________________________________________________________________________________________  INTERPRETATION NORMAL LEFT VENTRICULAR SYSTOLIC FUNCTION WITH AN ESTIMATED EF = 55 % NORMAL RIGHT VENTRICULAR SYSTOLIC FUNCTION MODERATE MITRAL VALVE INSUFFICIENCY MILD-TO-MODERATE TRICUSPID VALVE INSUFFICIENCY MILD AORTIC VALVE INSUFFICIENCY NO VALVULAR STENOSIS MODERATE-TO-SEVERE RA ENLARGEMENT MODERATE LA ENLARGEMENT MILD RV ENLARGEMENT  ___________________________________________________________________________________________  Electronically signed by: MD Miquel Dunn on 08/22/2016 01:50 PM Performed By: Johnathan Hausen, RDCS, RVT Ordering Physician: Isaias Cowman ___________________________________________________________________________________________   Status Results Details    Appointment on 08/21/2016 [place]")' href="[email or web address]/">Encounter Summary

## 2016-09-24 MED ORDER — DEXTROSE 5 % IV SOLN
1.5000 g | INTRAVENOUS | Status: AC
  Administered 2016-09-25: 1.5 g via INTRAVENOUS

## 2016-09-25 ENCOUNTER — Encounter
Admission: RE | Disposition: A | Discharge status: Home / Self Care | Payer: Self-pay | Source: Ambulatory Visit | Attending: Vascular Surgery

## 2016-09-25 ENCOUNTER — Ambulatory Visit: Payer: PPO | Admitting: Anesthesiology

## 2016-09-25 ENCOUNTER — Encounter: Payer: Self-pay | Admitting: *Deleted

## 2016-09-25 ENCOUNTER — Inpatient Hospital Stay
Admission: RE | Admit: 2016-09-25 | Discharge: 2016-09-26 | DRG: 269 | Disposition: A | Discharge status: Home / Self Care | Payer: PPO | Source: Ambulatory Visit | Attending: Vascular Surgery | Admitting: Vascular Surgery

## 2016-09-25 DIAGNOSIS — I714 Abdominal aortic aneurysm, without rupture, unspecified: Secondary | ICD-10-CM | POA: Diagnosis present

## 2016-09-25 DIAGNOSIS — E785 Hyperlipidemia, unspecified: Secondary | ICD-10-CM | POA: Diagnosis not present

## 2016-09-25 DIAGNOSIS — Z86010 Personal history of colonic polyps: Secondary | ICD-10-CM

## 2016-09-25 DIAGNOSIS — G4730 Sleep apnea, unspecified: Secondary | ICD-10-CM | POA: Diagnosis present

## 2016-09-25 DIAGNOSIS — Z87891 Personal history of nicotine dependence: Secondary | ICD-10-CM

## 2016-09-25 DIAGNOSIS — I739 Peripheral vascular disease, unspecified: Secondary | ICD-10-CM | POA: Diagnosis not present

## 2016-09-25 DIAGNOSIS — R319 Hematuria, unspecified: Secondary | ICD-10-CM | POA: Diagnosis not present

## 2016-09-25 DIAGNOSIS — M549 Dorsalgia, unspecified: Secondary | ICD-10-CM | POA: Diagnosis not present

## 2016-09-25 DIAGNOSIS — I10 Essential (primary) hypertension: Secondary | ICD-10-CM | POA: Diagnosis not present

## 2016-09-25 DIAGNOSIS — G8929 Other chronic pain: Secondary | ICD-10-CM | POA: Diagnosis present

## 2016-09-25 DIAGNOSIS — I4891 Unspecified atrial fibrillation: Secondary | ICD-10-CM | POA: Diagnosis not present

## 2016-09-25 DIAGNOSIS — I2510 Atherosclerotic heart disease of native coronary artery without angina pectoris: Secondary | ICD-10-CM | POA: Diagnosis not present

## 2016-09-25 DIAGNOSIS — I252 Old myocardial infarction: Secondary | ICD-10-CM | POA: Diagnosis not present

## 2016-09-25 HISTORY — PX: PERIPHERAL VASCULAR CATHETERIZATION: SHX172C

## 2016-09-25 LAB — ABO/RH: ABO/RH(D): A NEG

## 2016-09-25 LAB — CBC
HEMATOCRIT: 42.5 % (ref 40.0–52.0)
Hemoglobin: 14.5 g/dL (ref 13.0–18.0)
MCH: 33.3 pg (ref 26.0–34.0)
MCHC: 34.1 g/dL (ref 32.0–36.0)
MCV: 97.5 fL (ref 80.0–100.0)
PLATELETS: 215 10*3/uL (ref 150–440)
RBC: 4.36 MIL/uL — AB (ref 4.40–5.90)
RDW: 13.5 % (ref 11.5–14.5)
WBC: 9.2 10*3/uL (ref 3.8–10.6)

## 2016-09-25 LAB — PROTIME-INR
INR: 1.31
PROTHROMBIN TIME: 16.4 s — AB (ref 11.4–15.2)

## 2016-09-25 LAB — GLUCOSE, CAPILLARY: Glucose-Capillary: 110 mg/dL — ABNORMAL HIGH (ref 65–99)

## 2016-09-25 SURGERY — ENDOVASCULAR REPAIR/STENT GRAFT
Anesthesia: General

## 2016-09-25 MED ORDER — FENTANYL CITRATE (PF) 100 MCG/2ML IJ SOLN
INTRAMUSCULAR | Status: DC | PRN
  Administered 2016-09-25: 50 ug via INTRAVENOUS

## 2016-09-25 MED ORDER — SODIUM CHLORIDE 0.9 % IV SOLN
INTRAVENOUS | Status: DC
  Administered 2016-09-25 (×2): via INTRAVENOUS

## 2016-09-25 MED ORDER — SIMVASTATIN 40 MG PO TABS
40.0000 mg | ORAL_TABLET | Freq: Every day | ORAL | Status: DC
  Administered 2016-09-25: 40 mg via ORAL
  Filled 2016-09-25: qty 1

## 2016-09-25 MED ORDER — DEXTROSE 5 % IV SOLN
1.5000 g | Freq: Two times a day (BID) | INTRAVENOUS | Status: AC
  Administered 2016-09-25 – 2016-09-26 (×2): 1.5 g via INTRAVENOUS
  Filled 2016-09-25 (×2): qty 1.5

## 2016-09-25 MED ORDER — ONDANSETRON HCL 4 MG/2ML IJ SOLN: 4.0000 mg | Freq: Once | INTRAMUSCULAR | Status: DC | PRN

## 2016-09-25 MED ORDER — METHYLPREDNISOLONE SODIUM SUCC 125 MG IJ SOLR: 125.0000 mg | INTRAMUSCULAR | Status: DC | PRN

## 2016-09-25 MED ORDER — ALFUZOSIN HCL ER 10 MG PO TB24
10.0000 mg | ORAL_TABLET | Freq: Every day | ORAL | Status: DC
  Administered 2016-09-26: 10 mg via ORAL
  Filled 2016-09-25: qty 1

## 2016-09-25 MED ORDER — ACETAMINOPHEN 650 MG RE SUPP: 325.0000 mg | RECTAL | Status: DC | PRN

## 2016-09-25 MED ORDER — ALUM & MAG HYDROXIDE-SIMETH 200-200-20 MG/5ML PO SUSP: 15.0000 mL | ORAL | Status: DC | PRN

## 2016-09-25 MED ORDER — PHENOL 1.4 % MT LIQD
1.0000 | OROMUCOSAL | Status: DC | PRN
  Filled 2016-09-25 (×2): qty 177

## 2016-09-25 MED ORDER — DILTIAZEM HCL ER 180 MG PO CP24
180.0000 mg | ORAL_CAPSULE | Freq: Every day | ORAL | Status: DC
  Administered 2016-09-25: 180 mg via ORAL
  Filled 2016-09-25 (×3): qty 1

## 2016-09-25 MED ORDER — EPHEDRINE SULFATE 50 MG/ML IJ SOLN
INTRAMUSCULAR | Status: DC | PRN
  Administered 2016-09-25: 10 mg via INTRAVENOUS

## 2016-09-25 MED ORDER — NITROGLYCERIN IN D5W 200-5 MCG/ML-% IV SOLN: 5.0000 ug/min | INTRAVENOUS | Status: DC

## 2016-09-25 MED ORDER — GUAIFENESIN-DM 100-10 MG/5ML PO SYRP
15.0000 mL | ORAL_SOLUTION | ORAL | Status: DC | PRN
  Administered 2016-09-25: 15 mL via ORAL
  Filled 2016-09-25: qty 15

## 2016-09-25 MED ORDER — WARFARIN SODIUM 5 MG PO TABS
5.0000 mg | ORAL_TABLET | Freq: Every day | ORAL | Status: DC
  Administered 2016-09-25: 5 mg via ORAL
  Filled 2016-09-25: qty 1

## 2016-09-25 MED ORDER — SUGAMMADEX SODIUM 500 MG/5ML IV SOLN
INTRAVENOUS | Status: DC | PRN
  Administered 2016-09-25: 255 mg via INTRAVENOUS

## 2016-09-25 MED ORDER — LIDOCAINE HCL (CARDIAC) 20 MG/ML IV SOLN
INTRAVENOUS | Status: DC | PRN
  Administered 2016-09-25: 100 mg via INTRAVENOUS

## 2016-09-25 MED ORDER — ONDANSETRON HCL 4 MG/2ML IJ SOLN
4.0000 mg | Freq: Four times a day (QID) | INTRAMUSCULAR | Status: DC | PRN
  Administered 2016-09-25 – 2016-09-26 (×2): 4 mg via INTRAVENOUS
  Filled 2016-09-25 (×2): qty 2

## 2016-09-25 MED ORDER — FAMOTIDINE IN NACL 20-0.9 MG/50ML-% IV SOLN
20.0000 mg | Freq: Two times a day (BID) | INTRAVENOUS | Status: DC
  Administered 2016-09-25 – 2016-09-26 (×3): 20 mg via INTRAVENOUS
  Filled 2016-09-25 (×3): qty 50

## 2016-09-25 MED ORDER — SODIUM CHLORIDE 0.9 % IV SOLN: 500.0000 mL | Freq: Once | INTRAVENOUS | Status: DC | PRN

## 2016-09-25 MED ORDER — PROPOFOL 10 MG/ML IV BOLUS
INTRAVENOUS | Status: DC | PRN
  Administered 2016-09-25: 180 mg via INTRAVENOUS

## 2016-09-25 MED ORDER — FLUTICASONE PROPIONATE 50 MCG/ACT NA SUSP
1.0000 | Freq: Every day | NASAL | Status: DC | PRN
  Filled 2016-09-25: qty 16

## 2016-09-25 MED ORDER — OXYCODONE-ACETAMINOPHEN 5-325 MG PO TABS: 1.0000 | ORAL_TABLET | ORAL | Status: DC | PRN

## 2016-09-25 MED ORDER — HYDRALAZINE HCL 20 MG/ML IJ SOLN: 5.0000 mg | INTRAMUSCULAR | Status: DC | PRN

## 2016-09-25 MED ORDER — LACTATED RINGERS IV SOLN: INTRAVENOUS | Status: DC

## 2016-09-25 MED ORDER — DOPAMINE-DEXTROSE 3.2-5 MG/ML-% IV SOLN: 3.0000 ug/kg/min | INTRAVENOUS | Status: DC

## 2016-09-25 MED ORDER — FAMOTIDINE 20 MG PO TABS
ORAL_TABLET | ORAL | Status: AC
  Administered 2016-09-25: 20 mg via ORAL
  Filled 2016-09-25: qty 1

## 2016-09-25 MED ORDER — DILTIAZEM HCL 30 MG PO TABS: 60.0000 mg | ORAL_TABLET | Freq: Every day | ORAL | Status: DC | PRN

## 2016-09-25 MED ORDER — LABETALOL HCL 5 MG/ML IV SOLN: 10.0000 mg | INTRAVENOUS | Status: DC | PRN

## 2016-09-25 MED ORDER — ONDANSETRON HCL 4 MG/2ML IJ SOLN
INTRAMUSCULAR | Status: DC | PRN
  Administered 2016-09-25: 4 mg via INTRAVENOUS

## 2016-09-25 MED ORDER — ROCURONIUM BROMIDE 100 MG/10ML IV SOLN
INTRAVENOUS | Status: DC | PRN
  Administered 2016-09-25: 35 mg via INTRAVENOUS
  Administered 2016-09-25: 10 mg via INTRAVENOUS
  Administered 2016-09-25 (×2): 20 mg via INTRAVENOUS
  Administered 2016-09-25: 5 mg via INTRAVENOUS

## 2016-09-25 MED ORDER — SUCCINYLCHOLINE CHLORIDE 20 MG/ML IJ SOLN
INTRAMUSCULAR | Status: DC | PRN
  Administered 2016-09-25: 120 mg via INTRAVENOUS

## 2016-09-25 MED ORDER — PHENYLEPHRINE HCL 10 MG/ML IJ SOLN
INTRAMUSCULAR | Status: DC | PRN
  Administered 2016-09-25 (×6): 100 ug via INTRAVENOUS

## 2016-09-25 MED ORDER — HEPARIN SODIUM (PORCINE) 1000 UNIT/ML IJ SOLN
INTRAMUSCULAR | Status: DC | PRN
  Administered 2016-09-25: 6000 [IU] via INTRAVENOUS

## 2016-09-25 MED ORDER — FAMOTIDINE 20 MG PO TABS
20.0000 mg | ORAL_TABLET | Freq: Once | ORAL | Status: AC
  Administered 2016-09-25: 20 mg via ORAL

## 2016-09-25 MED ORDER — MAGNESIUM SULFATE 2 GM/50ML IV SOLN
2.0000 g | Freq: Every day | INTRAVENOUS | Status: DC | PRN
  Filled 2016-09-25: qty 50

## 2016-09-25 MED ORDER — HYDROMORPHONE HCL 1 MG/ML IJ SOLN: 1.0000 mg | Freq: Once | INTRAMUSCULAR | Status: DC

## 2016-09-25 MED ORDER — SODIUM CHLORIDE 0.9 % IV SOLN
INTRAVENOUS | Status: DC
  Administered 2016-09-25 – 2016-09-26 (×2): via INTRAVENOUS

## 2016-09-25 MED ORDER — FENTANYL CITRATE (PF) 100 MCG/2ML IJ SOLN
25.0000 ug | INTRAMUSCULAR | Status: DC | PRN
  Administered 2016-09-25 (×3): 25 ug via INTRAVENOUS

## 2016-09-25 MED ORDER — ONDANSETRON HCL 4 MG/2ML IJ SOLN: 4.0000 mg | Freq: Four times a day (QID) | INTRAMUSCULAR | Status: DC | PRN

## 2016-09-25 MED ORDER — VANCOMYCIN HCL IN DEXTROSE 1-5 GM/200ML-% IV SOLN
1000.0000 mg | Freq: Two times a day (BID) | INTRAVENOUS | Status: AC
  Administered 2016-09-25 – 2016-09-26 (×2): 1000 mg via INTRAVENOUS
  Filled 2016-09-25 (×2): qty 200

## 2016-09-25 MED ORDER — METOPROLOL TARTRATE 5 MG/5ML IV SOLN: 2.0000 mg | INTRAVENOUS | Status: DC | PRN

## 2016-09-25 MED ORDER — FAMOTIDINE 20 MG PO TABS: 40.0000 mg | ORAL_TABLET | ORAL | Status: DC | PRN

## 2016-09-25 MED ORDER — HYDROCODONE-ACETAMINOPHEN 5-325 MG PO TABS
1.0000 | ORAL_TABLET | Freq: Four times a day (QID) | ORAL | Status: DC | PRN
  Administered 2016-09-25 – 2016-09-26 (×4): 1 via ORAL
  Filled 2016-09-25 (×4): qty 1

## 2016-09-25 MED ORDER — ACETAMINOPHEN 500 MG PO TABS: 500.0000 mg | ORAL_TABLET | Freq: Four times a day (QID) | ORAL | Status: DC | PRN

## 2016-09-25 MED ORDER — BUSPIRONE HCL 5 MG PO TABS
7.5000 mg | ORAL_TABLET | Freq: Two times a day (BID) | ORAL | Status: DC | PRN
  Administered 2016-09-25 – 2016-09-26 (×2): 15 mg via ORAL
  Filled 2016-09-25 (×2): qty 3

## 2016-09-25 MED ORDER — MORPHINE SULFATE (PF) 4 MG/ML IV SOLN
2.0000 mg | INTRAVENOUS | Status: DC | PRN
  Administered 2016-09-25 (×2): 4 mg via INTRAVENOUS
  Filled 2016-09-25 (×2): qty 1

## 2016-09-25 MED ORDER — ASPIRIN EC 81 MG PO TBEC
81.0000 mg | DELAYED_RELEASE_TABLET | Freq: Every day | ORAL | Status: DC
  Administered 2016-09-25 – 2016-09-26 (×2): 81 mg via ORAL
  Filled 2016-09-25 (×2): qty 1

## 2016-09-25 MED ORDER — WARFARIN - PHYSICIAN DOSING INPATIENT: Freq: Every day | Status: DC

## 2016-09-25 MED ORDER — LINACLOTIDE 145 MCG PO CAPS
145.0000 ug | ORAL_CAPSULE | Freq: Every day | ORAL | Status: DC
  Administered 2016-09-26: 145 ug via ORAL
  Filled 2016-09-25: qty 1

## 2016-09-25 MED ORDER — POTASSIUM CHLORIDE CRYS ER 20 MEQ PO TBCR: 20.0000 meq | EXTENDED_RELEASE_TABLET | Freq: Every day | ORAL | Status: DC | PRN

## 2016-09-25 MED ORDER — DOCUSATE SODIUM 100 MG PO CAPS
100.0000 mg | ORAL_CAPSULE | Freq: Every day | ORAL | Status: DC
  Administered 2016-09-26: 100 mg via ORAL
  Filled 2016-09-25: qty 1

## 2016-09-25 MED ORDER — ACETAMINOPHEN 325 MG PO TABS
325.0000 mg | ORAL_TABLET | ORAL | Status: DC | PRN
  Administered 2016-09-26: 650 mg via ORAL
  Filled 2016-09-25: qty 2

## 2016-09-25 SURGICAL SUPPLY — 61 items
BALLN ATG 12X6X80 (BALLOONS) ×6
BALLOON ATG 12X6X80 (BALLOONS) ×2 IMPLANT
BLADE SURG 15 STRL LF DISP TIS (BLADE) ×1 IMPLANT
BLADE SURG 15 STRL SS (BLADE) ×2
BLADE SURG SZ11 CARB STEEL (BLADE) ×3 IMPLANT
BOOT SUTURE AID YELLOW STND (SUTURE) ×3 IMPLANT
BRUSH SCRUB 4% CHG (MISCELLANEOUS) ×3 IMPLANT
CATH 5FR REUT (CATHETERS) ×6 IMPLANT
CATH ACCU-VU SIZ PIG 5F 70CM (CATHETERS) ×6 IMPLANT
CATH BALLN CODA 9X100X32 (BALLOONS) ×3 IMPLANT
CATH BEACON 5.038 65CM KMP-01 (CATHETERS) ×6 IMPLANT
CATH C2 65CM (CATHETERS) ×3 IMPLANT
CATH G 5FX100 (CATHETERS) ×3 IMPLANT
DERMABOND ADVANCED (GAUZE/BANDAGES/DRESSINGS) ×2
DERMABOND ADVANCED .7 DNX12 (GAUZE/BANDAGES/DRESSINGS) ×1 IMPLANT
DEVICE CLOSURE PERCLS PRGLD 6F (VASCULAR PRODUCTS) ×4 IMPLANT
DEVICE PRESTO INFLATION (MISCELLANEOUS) ×6 IMPLANT
DRYSEAL FLEXSHEATH 12FR 33CM (SHEATH) ×2
DRYSEAL FLEXSHEATH 16FR 33CM (SHEATH) ×2
ELECT REM PT RETURN 9FT ADLT (ELECTROSURGICAL) ×3
ELECTRODE REM PT RTRN 9FT ADLT (ELECTROSURGICAL) ×1 IMPLANT
EXCLUDER TNK LEG 26MX14X14 (Endovascular Graft) ×1 IMPLANT
EXCLUDER TRUNK LEG 26MX14X14 (Endovascular Graft) ×3 IMPLANT
GLIDEWIRE STIFF .35X180X3 HYDR (WIRE) ×3 IMPLANT
GLOVE BIO SURGEON STRL SZ7 (GLOVE) ×6 IMPLANT
GLOVE BIO SURGEON STRL SZ8 (GLOVE) ×3 IMPLANT
GOWN STRL REUS W/ TWL LRG LVL3 (GOWN DISPOSABLE) ×1 IMPLANT
GOWN STRL REUS W/ TWL XL LVL3 (GOWN DISPOSABLE) ×2 IMPLANT
GOWN STRL REUS W/TWL LRG LVL3 (GOWN DISPOSABLE) ×2
GOWN STRL REUS W/TWL XL LVL3 (GOWN DISPOSABLE) ×4
GRAFT EXCLUDER LEG (Endovascular Graft) ×3 IMPLANT
LEG CONTRALATERAL 20X11.5 (Vascular Products) ×2 IMPLANT
LOOP RED MAXI  1X406MM (MISCELLANEOUS) ×2
LOOP VESSEL MAXI 1X406 RED (MISCELLANEOUS) ×1 IMPLANT
LOOP VESSEL MINI 0.8X406 BLUE (MISCELLANEOUS) ×1 IMPLANT
LOOPS BLUE MINI 0.8X406MM (MISCELLANEOUS) ×2
PACK ANGIOGRAPHY (CUSTOM PROCEDURE TRAY) ×3 IMPLANT
PACK BASIN MAJOR ARMC (MISCELLANEOUS) ×3 IMPLANT
PERCLOSE PROGLIDE 6F (VASCULAR PRODUCTS) ×12
SET INTRO CAPELLA COAXIAL (SET/KITS/TRAYS/PACK) ×3 IMPLANT
SHEATH BRITE TIP 6FRX11 (SHEATH) ×6 IMPLANT
SHEATH BRITE TIP 8FRX11 (SHEATH) ×6 IMPLANT
SHEATH DRYSEAL FLEX 12FR 33CM (SHEATH) ×1 IMPLANT
SHEATH DRYSEAL FLEX 16FR 33CM (SHEATH) ×1 IMPLANT
SPONGE XRAY 4X4 16PLY STRL (MISCELLANEOUS) ×12 IMPLANT
STENT GRAFT CONTRALAT 20X11.5 (Vascular Products) ×1 IMPLANT
SUT MNCRL 4-0 (SUTURE) ×2
SUT MNCRL 4-0 27XMFL (SUTURE) ×1
SUT PROLENE 6 0 BV (SUTURE) ×3 IMPLANT
SUT SILK 2 0 (SUTURE) ×2
SUT SILK 2-0 18XBRD TIE 12 (SUTURE) ×1 IMPLANT
SUT SILK 3 0 (SUTURE) ×2
SUT SILK 3-0 18XBRD TIE 12 (SUTURE) ×1 IMPLANT
SUT SILK 4 0 (SUTURE) ×2
SUT SILK 4-0 18XBRD TIE 12 (SUTURE) ×1 IMPLANT
SUTURE MNCRL 4-0 27XMF (SUTURE) ×1 IMPLANT
SYR 20CC LL (SYRINGE) ×3 IMPLANT
SYR MEDRAD MARK V 150ML (SYRINGE) ×3 IMPLANT
TUBING CONTRAST HIGH PRESS 72 (TUBING) ×3 IMPLANT
WIRE AMPLATZ SSTIFF .035X260CM (WIRE) ×6 IMPLANT
WIRE J 3MM .035X145CM (WIRE) ×6 IMPLANT

## 2016-09-25 NOTE — H&P (Signed)
[facility] Admission History & Physical  MRN : [id number]  Richard Wilkins is a 81 y.o. (11-Jun-1935) male who presents with chief complaint of No chief complaint on file. [name] Kitchen  History of Present Illness: Patient with >5 cm AAA.  Here for elective repair.  No new complaints today.  No fever or chills. No abdominal or back pain. No signs of peripheral embolization.    Current Facility-Administered Medications  Medication Dose Route Frequency Provider Last Rate Last Dose  . 0.9 %  sodium chloride infusion   Intravenous Continuous Kimberly A Stegmayer, PA-C 50 mL/hr at 09/25/16 0710    . cefUROXime (ZINACEF) 1.5 g in dextrose 5 % 50 mL IVPB  1.5 g Intravenous 30 min Pre-Op Kimberly A Stegmayer, PA-C      . famotidine (PEPCID) tablet 40 mg  40 mg Oral PRN Janalyn Harder Stegmayer, PA-C      . HYDROmorphone (DILAUDID) injection 1 mg  1 mg Intravenous Once [employer], PA-C      . lactated ringers infusion   Intravenous Continuous Gunnar Bulla, MD      . methylPREDNISolone sodium succinate (SOLU-MEDROL) 125 mg/2 mL injection 125 mg  125 mg Intravenous PRN Kimberly A Stegmayer, PA-C      . ondansetron (ZOFRAN) injection 4 mg  4 mg Intravenous Q6H PRN Sela Hua, PA-C        Past Medical History:  Diagnosis Date  . Abdominal aortic aneurysm (AAA) ([place])   . Atrial fibrillation ([facility])   . Cancer ([place])   . Chronic back pain   . Colon polyp   . Constipation   . Coronary artery disease   . Elevated lipids   . Hemorrhage   . Hyperlipidemia   . Hypertension   . Myocardial infarction   . Sleep apnea     Past Surgical History:  Procedure Laterality Date  . CARDIAC SURGERY    . HEMORRHOID SURGERY    . IRRIGATION AND DEBRIDEMENT HEMATOMA      Social History Social History  Substance Use Topics  . Smoking status: Former Smoker    Quit date: 11/11/2002  . Smokeless tobacco: Never Used  . Alcohol use No  married, lives with wife  Family History Family  History  Problem Relation Age of Onset  . Cancer - Other Brother   No bleeding disorders, clotting disorders, or aneurysms  No Known Allergies   REVIEW OF SYSTEMS (Negative unless checked)  Constitutional: [] Weight loss  [] Fever  [] Chills Cardiac: [] Chest pain   [] Chest pressure   [x] Palpitations   [] Shortness of breath when laying flat   [] Shortness of breath at rest   [] Shortness of breath with exertion. Vascular:  [] Pain in legs with walking   [] Pain in legs at rest   [] Pain in legs when laying flat   [] Claudication   [] Pain in feet when walking  [] Pain in feet at rest  [] Pain in feet when laying flat   [] History of DVT   [] Phlebitis   [] Swelling in legs   [] Varicose veins   [] Non-healing ulcers Pulmonary:   [] Uses home oxygen   [] Productive cough   [] Hemoptysis   [] Wheeze  [] COPD   [] Asthma Neurologic:  [] Dizziness  [] Blackouts   [] Seizures   [] History of stroke   [] History of TIA  [] Aphasia   [] Temporary blindness   [] Dysphagia   [] Weakness or numbness in arms   [] Weakness or numbness in legs Musculoskeletal:  [] Arthritis   [] Joint swelling   [] Joint  pain   [x] Low back pain Hematologic:  [] Easy bruising  [] Easy bleeding   [] Hypercoagulable state   [] Anemic  [] Hepatitis Gastrointestinal:  [] Blood in stool   [] Vomiting blood  [] Gastroesophageal reflux/heartburn   [] Difficulty swallowing. Genitourinary:  [] Chronic kidney disease   [] Difficult urination  [] Frequent urination  [] Burning with urination   [] Blood in urine Skin:  [] Rashes   [] Ulcers   [] Wounds Psychological:  [] History of anxiety   []  History of major depression.  Physical Examination  Vitals:   09/25/16 0634  BP: 140/83  Pulse: 78  Temp: 97.8 F (36.6 C)  SpO2: 95%  Weight: 127 kg (280 lb)  Height: 5\' 10"  (1.778 m)   Body mass index is 40.18 kg/m. Gen: WD/WN, NAD Head: [place]/AT, No temporalis wasting. Prominent temp pulse not noted. Ear/Nose/Throat: Hearing grossly intact, nares w/o erythema or drainage, oropharynx  w/o Erythema/Exudate,  Eyes: Conjunctiva clear, sclera non-icteric Neck: Trachea midline.  No JVD.  Pulmonary:  Good air movement, respirations not labored, no use of accessory muscles.  Cardiac: Irregularly irregular Vascular:  Vessel Right Left  Radial Palpable Palpable  Ulnar Palpable Palpable  Brachial Palpable Palpable  Carotid Palpable, without bruit Palpable, without bruit  Aorta Not palpable N/A  Femoral Palpable Palpable  Popliteal Palpable Palpable  PT Palpable Palpable  DP Palpable Palpable   Gastrointestinal: soft, non-tender/non-distended. No guarding/reflex. Aorta not easily palpable due to body habitus Musculoskeletal: M/S 5/5 throughout.  Extremities without ischemic changes.  No deformity or atrophy.  Neurologic: Sensation grossly intact in extremities.  Symmetrical.  Speech is fluent. Motor exam as listed above. Psychiatric: Judgment intact, Mood & affect appropriate for pt's clinical situation. Dermatologic: No rashes or ulcers noted.  No cellulitis or open wounds. Lymph : No Cervical, Axillary, or Inguinal lymphadenopathy.     CBC Lab Results  Component Value Date   WBC 10.5 08/09/2016   HGB 15.1 08/09/2016   HCT 44.2 08/09/2016   MCV 97.3 08/09/2016   PLT 223 08/09/2016    BMET    Component Value Date/Time   NA 138 08/09/2016 0510   NA 140 10/21/2014 0535   K 3.6 08/09/2016 0510   K 3.7 10/21/2014 0535   CL 106 08/09/2016 0510   CL 107 10/21/2014 0535   CO2 27 08/09/2016 0510   CO2 28 10/21/2014 0535   GLUCOSE 111 (H) 08/09/2016 0510   GLUCOSE 101 (H) 10/21/2014 0535   BUN 11 09/18/2016 1238   BUN 13 10/21/2014 0535   CREATININE 0.53 (L) 09/18/2016 1238   CREATININE 0.64 10/21/2014 0535   CALCIUM 9.4 08/09/2016 0510   CALCIUM 8.3 (L) 10/21/2014 0535   GFRNONAA >60 09/18/2016 1238   GFRNONAA >60 10/21/2014 0535   GFRAA >60 09/18/2016 1238   GFRAA >60 10/21/2014 0535   Estimated Creatinine Clearance: 100.2 mL/min (by C-G formula based on  SCr of 0.53 mg/dL (L)).  COAG Lab Results  Component Value Date   INR 2.12 08/09/2016   INR 2.28 08/08/2016   INR 2.8 10/20/2014    Radiology No results found.    Assessment/Plan 1. AAA. For repair today. Risks and benefits discussed 2. HTN. Stable on outpatient medications 3. Atrial fibrillation. On anticoagulation held for surgery.  INR 1.3 today.  Will resume after procedure   Leotis Pain, MD  09/25/2016 7:42 AM

## 2016-09-25 NOTE — Anesthesia Procedure Notes (Signed)
Procedure Name: Intubation Date/Time: 09/25/2016 7:58 AM Performed by: Aline Brochure Pre-anesthesia Checklist: Patient identified, Emergency Drugs available, Suction available and Patient being monitored Patient Re-evaluated:Patient Re-evaluated prior to inductionOxygen Delivery Method: Circle system utilized Preoxygenation: Pre-oxygenation with 100% oxygen Intubation Type: IV induction Ventilation: Mask ventilation with difficulty Laryngoscope Size: Mac and 4 Grade View: Grade I Tube type: Oral Tube size: 7.5 mm Number of attempts: 1 Airway Equipment and Method: Stylet Placement Confirmation: ETT inserted through vocal cords under direct vision,  positive ETCO2 and breath sounds checked- equal and bilateral Secured at: 22 cm Tube secured with: Tape Dental Injury: Teeth and Oropharynx as per pre-operative assessment

## 2016-09-25 NOTE — Progress Notes (Signed)
[facility]  Daily Progress Note   Subjective  - Day of Surgery  Feels well.  Mild sore throat. Still with some hematuria.  A little more clear than immediately after surgery, but present.  Objective Vitals:   09/25/16 1200 09/25/16 1300 09/25/16 1400 09/25/16 1500  BP: 139/87 129/78 126/75 128/85  Pulse: 76 87 (!) 41 (!) 44  Resp: 19 13 18 17   Temp: 98.4 F (36.9 C)     TempSrc: Oral     SpO2: 94% 93% 92% 91%  Weight:      Height:        Intake/Output Summary (Last 24 hours) at 09/25/16 1549 Last data filed at 09/25/16 1500  Gross per 24 hour  Intake          2006.25 ml  Output             1600 ml  Net           406.25 ml    PULM  CTAB CV  RRR VASC  Feet warm and well perfused, access sites C/D/I  Laboratory CBC    Component Value Date/Time   WBC 9.2 09/25/2016 1314   HGB 14.5 09/25/2016 1314   HGB 9.6 (L) 10/21/2014 0535   HCT 42.5 09/25/2016 1314   HCT 29.0 (L) 10/21/2014 0535   PLT 215 09/25/2016 1314   PLT 221 10/21/2014 0535    BMET    Component Value Date/Time   NA 138 08/09/2016 0510   NA 140 10/21/2014 0535   K 3.6 08/09/2016 0510   K 3.7 10/21/2014 0535   CL 106 08/09/2016 0510   CL 107 10/21/2014 0535   CO2 27 08/09/2016 0510   CO2 28 10/21/2014 0535   GLUCOSE 111 (H) 08/09/2016 0510   GLUCOSE 101 (H) 10/21/2014 0535   BUN 11 09/18/2016 1238   BUN 13 10/21/2014 0535   CREATININE 0.53 (L) 09/18/2016 1238   CREATININE 0.64 10/21/2014 0535   CALCIUM 9.4 08/09/2016 0510   CALCIUM 8.3 (L) 10/21/2014 0535   GFRNONAA >60 09/18/2016 1238   GFRNONAA >60 10/21/2014 0535   GFRAA >60 09/18/2016 1238   GFRAA >60 10/21/2014 0535    Assessment/Planning: POD #0 s/p endovascular AAA repair   Doing well  Advance diet  KVO IVF  Check labs in am  Hematuria may be Foley trauma plus heparin from the procedure, but if it does not clear by the morning will ask Urology to see the patient prior to discharge    Leotis Pain  09/25/2016, 3:49 PM

## 2016-09-25 NOTE — Op Note (Signed)
OPERATIVE NOTE   PROCEDURE: 1. US guidance for vascular access, bilateral femoral arteries 2. Catheter placement into aorta from bilateral femoral approaches 3. Placement of a 26 mm proximal 14 cm length Gore Excluder Endoprosthesis main body right with a 20 mm diameter x 12 cm length contralateral limb 4. Placement of a right iliac artery extender limb with a 14 mm x 10 cm device 5. ProGlide closure devices bilateral femoral arteries  PRE-OPERATIVE DIAGNOSIS: AAA  POST-OPERATIVE DIAGNOSIS: same  SURGEON: Leotis Pain, MD and Hortencia Pilar, MD - Co-surgeons  ANESTHESIA: General  ESTIMATED BLOOD LOSS: 25 cc  FINDING(S): 1.  AAA  SPECIMEN(S):  none  INDICATIONS:   Richard Wilkins is a 81 y.o. male who presents with a 5 cm AAA.  He has anatomy for a stent graft repair.  Risks and benefits are discussed.  DESCRIPTION: After obtaining full informed written consent, the patient was brought back to the operating room and placed supine upon the operating table.  The patient received IV antibiotics prior to induction.  After obtaining adequate anesthesia, the patient was prepped and draped in the standard fashion for endovascular AAA repair.  We then began by gaining access to both femoral arteries with US guidance with me working on the right and Dr. Delana Meyer working on the left.  The femoral arteries were found to be patent and accessed without difficulty with a needle under ultrasound guidance without difficulty on each side and permanent images were recorded.  We then placed 2 proglide devices on each side in a pre-close fashion and placed 8 French sheaths. The patient was then given  6000 units of intravenous heparin. The Pigtail catheter was placed into the aorta from the right side. Using this image, we selected a 26 mm diameter proximal 14 cm length Main body device.  Over a stiff wire, an 21 French sheath was placed up the right side. The main body was then placed through the 18 French  sheath. A Kumpe catheter was placed up the left side and a magnified image at the renal arteries was performed. The main body was then deployed just below the lowest renal artery. The Kumpe catheter was used to cannulate the contralateral gate with only a mild amount of difficulty and successful cannulation was confirmed by twirling the pigtail catheter in the main body. We then placed a stiff wire and a retrograde arteriogram was performed through the left femoral sheath. We upsized to the 12 [place] sheath for the contralateral limb and a 20 mm diameter 12 cm length limb was selected and deployed. The main body deployment was then completed. Based off the angiographic findings, extension limbs were necessary to get closer to the hypogastric artery on the right.  A 14 mm diameter x 10 cm length right iliac limb was then deployed to just above the right hypogastric artery. All junction points and seals zones were treated with the compliant balloon and 12 mm diameter kissing balloons were used at the aortic bifurcation that was still sightly constrained. The pigtail catheter was then replaced and a completion angiogram was performed.   No Endoleak was detected on completion angiography. The renal arteries were found to be widely patent. At this point we elected to terminate the procedure. We secured the pro glide devices for hemostasis on the femoral arteries. The skin incision was closed with a 4-0 Monocryl. Dermabond and pressure dressing were placed. The patient was taken to the recovery room in stable condition having tolerated the procedure  well.  COMPLICATIONS: none  CONDITION: stable  Leotis Pain  09/25/2016, 9:53 AM   This note was created with Dragon Medical transcription system. Any errors in dictation are purely unintentional.

## 2016-09-25 NOTE — Transfer of Care (Signed)
Immediate Anesthesia Transfer of Care Note  Patient: Richard Wilkins  Procedure(s) Performed: Procedure(s): Endovascular Repair/Stent Graft (N/A)  Patient Location: PACU  Anesthesia Type:General  Level of Consciousness: awake, alert  and oriented  Airway & Oxygen Therapy: Patient connected to face mask oxygen  Post-op Assessment: Post -op Vital signs reviewed and stable  Post vital signs: stable  Last Vitals:  Vitals:   09/25/16 0634 09/25/16 1006  BP: 140/83 127/88  Pulse: 78 70  Resp:  20  Temp: 36.6 C 36.4 C    Last Pain:  Vitals:   09/25/16 1006  TempSrc: Temporal  PainSc:          Complications: No apparent anesthesia complications

## 2016-09-25 NOTE — Op Note (Signed)
OPERATIVE NOTE   PROCEDURE: 1. US guidance for vascular access, bilateral femoral arteries 2. Catheter placement into aorta from bilateral femoral approaches 3. Placement of a 26 x 14 x 14 C3 Gore Excluder Endoprosthesis main body with a 20 I 12 contralateral limb 4. Placement of a 14 x 10 Gore iliac extender cuff 5. ProGlide closure devices bilateral femoral arteries  PRE-OPERATIVE DIAGNOSIS: AAA  POST-OPERATIVE DIAGNOSIS: same  SURGEON: Hortencia Pilar, MD and Leotis Pain, MD - Co-surgeons  ANESTHESIA: general  ESTIMATED BLOOD LOSS: 75 cc  FINDING(S): 1.  AAA  SPECIMEN(S):  none  INDICATIONS:   Richard Wilkins is a 81 y.o. y.o. male who presents with abdominal aortic aneurysm that has now increased to a size greater than 5 cm. He is an acceptable candidate for endovascular repair and is therefore undergoing stenting of his aneurysm to prevent lethal rupture. The risks and benefits have been discussed with the patient all questions were answered the patient has agreed to proceed with aneurysm repair.  DESCRIPTION: After obtaining full informed written consent, the patient was brought back to the operating room and placed supine upon the operating table.  The patient received IV antibiotics prior to induction.  After obtaining adequate anesthesia, the patient was prepped and draped in the standard fashion for endovascular AAA repair.  We then began by gaining access to both femoral arteries with US guidance with me working on the left and Dr. Lucky Cowboy working on the right.  The femoral arteries were found to be patent and accessed without difficulty with a needle under ultrasound guidance without difficulty on each side and permanent images were recorded.  We then placed 2 proglide devices on each side in a pre-close fashion and placed 8 French sheaths.   The patient was then given  6000 units of intravenous heparin.   The Pigtail catheter was placed into the aorta from the  right side.  Using this image, we selected a 26 x 14 x 14 Main body device.  Over a stiff wire, an 4 French sheath was placed. The main body was then placed through the 18 French sheath. A Kumpe catheter was placed up the left side and a magnified image at the renal arteries was performed. The main body was then deployed just below the lowest renal artery which was the right. The Kumpe catheter was used to cannulate the contralateral gate minimal difficulty and successful cannulation was confirmed by twirling the pigtail catheter in the main body. We then placed a stiff wire and a retrograde arteriogram was performed through the left femoral sheath. We upsized to the 12 [place] sheath for the contralateral limb and a 20 x 12 limb was selected and deployed. The main body deployment was then completed. Based off the angiographic findings, extension limbs were necessary.  A 14 x 10 iliac extender cuff was then advanced up the right side and deployed so that its distal margin was just above the iliac bifurcation.   All junction points and seals zones were treated with the compliant balloon. The pigtail catheter was then replaced and a completion angiogram was performed.   No Endoleak was detected on completion angiography. The renal arteries were found to be widely patent.  At this point we elected to terminate the procedure. We secured the pro glide devices for hemostasis on the femoral arteries. The skin incision was closed with a 4-0 Monocryl. Dermabond and pressure dressing were placed. The patient was taken to the recovery room in stable  condition having tolerated the procedure well.  COMPLICATIONS: none  CONDITION: stable  Richard Wilkins  03/18/2015, 3:51 PM

## 2016-09-25 NOTE — Progress Notes (Signed)
Irrigated foley in 50cc out 50 red urine  Red and slightly thick   Dr dew aware

## 2016-09-25 NOTE — Progress Notes (Signed)
Pharmacy Antibiotic Note  Richard Wilkins is a 81 y.o. male admitted on 09/25/2016 for AAA repair.  Pharmacy has been consulted for perioperative antibiotic dosing.  Plan: Antibiotics are dosed appropriately with no need for adjustment.   Height: 5\' 10"  (177.8 cm) Weight: 280 lb (127 kg) IBW/kg (Calculated) : 73  Temp (24hrs), Avg:97.6 F (36.4 C), Min:97.5 F (36.4 C), Max:97.8 F (36.6 C)   Recent Labs Lab 09/18/16 1238  CREATININE 0.53*    Estimated Creatinine Clearance: 100.2 mL/min (by C-G formula based on SCr of 0.53 mg/dL (L)).    No Known Allergies   Thank you for allowing pharmacy to be a part of this patient's care.  Ulice Dash D 09/25/2016 12:16 PM

## 2016-09-25 NOTE — Anesthesia Preprocedure Evaluation (Addendum)
Anesthesia Evaluation  Patient identified by MRN, date of birth, ID band Patient awake    Reviewed: Allergy & Precautions, NPO status , Patient's Chart, lab work & pertinent test results  Airway Mallampati: III  TM Distance: >3 FB     Dental  (+) Chipped   Pulmonary sleep apnea , former smoker,    Pulmonary exam normal        Cardiovascular hypertension, Pt. on medications + CAD, + Past MI and + Peripheral Vascular Disease  Normal cardiovascular exam+ dysrhythmias Atrial Fibrillation      Neuro/Psych negative neurological ROS  negative psych ROS   GI/Hepatic Neg liver ROS, Colon polyp   Endo/Other  negative endocrine ROS  Renal/GU negative Renal ROS  negative genitourinary   Musculoskeletal negative musculoskeletal ROS (+)   Abdominal Normal abdominal exam  (+)   Peds negative pediatric ROS (+)  Hematology negative hematology ROS (+)   Anesthesia Other Findings Past Medical History: No date: Abdominal aortic aneurysm (AAA) ([facility]) No date: Atrial fibrillation ([facility]) No date: Cancer ([place]) No date: Chronic back pain No date: Colon polyp Last dose of coumadin was Saturday night No date: Constipation No date: Coronary artery disease No date: Elevated lipids No date: Hemorrhage No date: Hyperlipidemia No date: Hypertension No date: Myocardial infarction No date: Sleep apnea  Reproductive/Obstetrics                            Anesthesia Physical Anesthesia Plan  ASA: III  Anesthesia Plan: General   Post-op Pain Management:    Induction: Intravenous and Rapid sequence  Airway Management Planned: Oral ETT  Additional Equipment:   Intra-op Plan:   Post-operative Plan: Extubation in OR  Informed Consent: I have reviewed the patients History and Physical, chart, labs and discussed the procedure including the risks, benefits and alternatives for the proposed anesthesia with the  patient or authorized representative who has indicated his/her understanding and acceptance.   Dental advisory given  Plan Discussed with: CRNA and Surgeon  Anesthesia Plan Comments:         Anesthesia Quick Evaluation

## 2016-09-26 ENCOUNTER — Encounter: Payer: Self-pay | Admitting: Vascular Surgery

## 2016-09-26 LAB — BASIC METABOLIC PANEL
ANION GAP: 5 (ref 5–15)
BUN: 12 mg/dL (ref 6–20)
CALCIUM: 8.9 mg/dL (ref 8.9–10.3)
CO2: 27 mmol/L (ref 22–32)
Chloride: 105 mmol/L (ref 101–111)
Creatinine, Ser: 0.64 mg/dL (ref 0.61–1.24)
Glucose, Bld: 134 mg/dL — ABNORMAL HIGH (ref 65–99)
Potassium: 4 mmol/L (ref 3.5–5.1)
Sodium: 137 mmol/L (ref 135–145)

## 2016-09-26 LAB — CBC
HEMATOCRIT: 40.2 % (ref 40.0–52.0)
Hemoglobin: 13.8 g/dL (ref 13.0–18.0)
MCH: 33.3 pg (ref 26.0–34.0)
MCHC: 34.3 g/dL (ref 32.0–36.0)
MCV: 97.2 fL (ref 80.0–100.0)
PLATELETS: 217 10*3/uL (ref 150–440)
RBC: 4.14 MIL/uL — ABNORMAL LOW (ref 4.40–5.90)
RDW: 13.3 % (ref 11.5–14.5)
WBC: 13.9 10*3/uL — AB (ref 3.8–10.6)

## 2016-09-26 LAB — PROTIME-INR
INR: 1.21
PROTHROMBIN TIME: 15.4 s — AB (ref 11.4–15.2)

## 2016-09-26 LAB — MAGNESIUM: MAGNESIUM: 1.7 mg/dL (ref 1.7–2.4)

## 2016-09-26 MED ORDER — FAMOTIDINE 20 MG PO TABS: 20.0000 mg | ORAL_TABLET | Freq: Two times a day (BID) | ORAL | Status: DC

## 2016-09-26 MED ORDER — HYDROCODONE-ACETAMINOPHEN 5-325 MG PO TABS: 1.0000 | ORAL_TABLET | Freq: Four times a day (QID) | ORAL | 0 refills | Status: AC | PRN

## 2016-09-26 MED ORDER — ALFUZOSIN HCL ER 10 MG PO TB24: 10.0000 mg | ORAL_TABLET | Freq: Every day | ORAL | Status: AC

## 2016-09-26 NOTE — Plan of Care (Signed)
Problem: Education: Goal: Knowledge of [facility] Education information/materials will improve Outcome: Progressing Notified MD of the pauses the patient had both this morning around 5 am and this afternoon 2.45 sec pause  MD ok for dc  Once pt hr dropped to 42 but didn't sustain  Pt felt a little dizzy once when he got up, but that was relieved when pt was restarted on fluids  Pt appropriate for dc per md

## 2016-09-26 NOTE — Discharge Instructions (Signed)
Patient has appointment to check INR with Dr. Jens Som. You may shower as of Friday.

## 2016-09-26 NOTE — Anesthesia Postprocedure Evaluation (Signed)
Anesthesia Post Note  Patient: Richard Wilkins  Procedure(s) Performed: Procedure(s) (LRB): Endovascular Repair/Stent Graft (N/A)  Patient location during evaluation: ICU Anesthesia Type: General Level of consciousness: awake, awake and alert and oriented Pain management: pain level controlled Vital Signs Assessment: post-procedure vital signs reviewed and stable Respiratory status: spontaneous breathing, nonlabored ventilation and respiratory function stable Cardiovascular status: blood pressure returned to baseline and stable Postop Assessment: no signs of nausea or vomiting Anesthetic complications: no    Last Vitals:  Vitals:   09/26/16 0300 09/26/16 0400  BP: 120/70 108/69  Pulse: (!) 58 73  Resp: (!) 28 (!) 24  Temp:      Last Pain:  Vitals:   09/25/16 2216  TempSrc:   PainSc: 3                  Tadeusz Stahl,  Castin Donaghue R

## 2016-09-26 NOTE — Progress Notes (Signed)
Key Points: Use following P&T approved IV to PO antibiotic change policy.  Description contains the criteria that are approved Note: Policy Excludes:  Esophagectomy patientsPHARMACIST - PHYSICIAN COMMUNICATION DR:   Lucky Cowboy CONCERNING: IV to Oral Route Change Policy  RECOMMENDATION: This patient is receiving famotidine 20 mg q 12 hours by the intravenous route.  Based on criteria approved by the Pharmacy and Therapeutics Committee, the intravenous medication(s) is/are being converted to the equivalent oral dose form(s).   DESCRIPTION: These criteria include:  The patient is eating (either orally or via tube) and/or has been taking other orally administered medications for a least 24 hours  The patient has no evidence of active gastrointestinal bleeding or impaired GI absorption (gastrectomy, short bowel, patient on TNA or NPO).  If you have questions about this conversion, please contact the [place], PharmD Pharmacy Resident 09/26/2016 8:43 AM

## 2016-09-26 NOTE — Discharge Summary (Signed)
[facility]    Discharge Summary    Patient ID:  Richard Wilkins MRN: [id number] DOB/AGE: [id number] y.o.  Admit date: 09/25/2016 Discharge date: 09/26/2016 Date of Surgery: 09/25/2016 Surgeon: Surgeon(s): Algernon Huxley, MD  Admission Diagnosis: AAA     AAA repair    GORE rep       Need anesthesia Leah called per Mickel Baas  cc:  Althea Grimmer Bartles  Discharge Diagnoses:  AAA     AAA repair    GORE rep       Need anesthesia Leah called per Mickel Baas  cc:  M Godley  K Bartles  Secondary Diagnoses: Past Medical History:  Diagnosis Date  . Abdominal aortic aneurysm (AAA) ([place])   . Atrial fibrillation ([place])   . Cancer ([place])   . Chronic back pain   . Colon polyp   . Constipation   . Coronary artery disease   . Elevated lipids   . Hemorrhage   . Hyperlipidemia   . Hypertension   . Myocardial infarction   . Sleep apnea     Procedure(s): Endovascular Repair/Stent Graft  Discharged Condition: good  HPI:  Liav Rockel is a 81 year old male who presents with a 5cm AAA.  He has anatomy for a stent graft repair. On 09/25/16, the patient underwent US guidance for vascular access, bilateral femoral arteries, Catheter placement into aorta from bilateral femoral approaches, Placement of a 26 mm proximal 14 cm length Gore Excluder Endoprosthesis main body right with a 20 mm diameter x 12 cm length contralateral limb, Placement of a right iliac artery extender limb with a 14 mm x 10 cm device with ProGlide closure devices bilateral femoral arteries. Patient tolerated the procedure well and was transferred from the PACU to ICU for observation. Patients night of surgery was unremarkable. During his brief inpatient stay the patients diet was advanced, his foley was removed, his pain was treated with PO medication and he was ambulating independently.   Hospital Course:  Sheddrick Arrona is a 81 y.o. male is S/P   Procedure(s): Endovascular Repair/Stent Graft  Extubated: POD  # 0  Physical exam:  A&Ox3, NAD CV: Irreg Irreg Pulm: CTA Bilaterally Abdomen; Soft, Non-tender, Non-distended Groins: No Swelling or drainage Extremity: warm, nontender, minimal edema  Post-op wounds clean, dry, intact or healing well  Pt. Ambulating, voiding and taking PO diet without difficulty.  Pt pain controlled with PO pain meds.  Labs as below  Complications:none  Consults:  None  Significant Diagnostic Studies: CBC Lab Results  Component Value Date   WBC 13.9 (H) 09/26/2016   HGB 13.8 09/26/2016   HCT 40.2 09/26/2016   MCV 97.2 09/26/2016   PLT 217 09/26/2016   BMET    Component Value Date/Time   NA 137 09/26/2016 0315   NA 140 10/21/2014 0535   K 4.0 09/26/2016 0315   K 3.7 10/21/2014 0535   CL 105 09/26/2016 0315   CL 107 10/21/2014 0535   CO2 27 09/26/2016 0315   CO2 28 10/21/2014 0535   GLUCOSE 134 (H) 09/26/2016 0315   GLUCOSE 101 (H) 10/21/2014 0535   BUN 12 09/26/2016 0315   BUN 13 10/21/2014 0535   CREATININE 0.64 09/26/2016 0315   CREATININE 0.64 10/21/2014 0535   CALCIUM 8.9 09/26/2016 0315   CALCIUM 8.3 (L) 10/21/2014 0535   GFRNONAA >60 09/26/2016 0315   GFRNONAA >60 10/21/2014 0535   GFRAA >60 09/26/2016 0315   GFRAA >60 10/21/2014 [id number]  COAG Lab Results  Component Value Date   INR 1.21 09/26/2016   INR 1.31 09/25/2016   INR 2.12 08/09/2016   Disposition:  Discharge to :Home    Medication List    TAKE these medications   acetaminophen 500 MG tablet Commonly known as:  TYLENOL Take 500-1,000 mg by mouth every 6 (six) hours as needed (for pain.).   alfuzosin 10 MG 24 hr tablet Commonly known as:  UROXATRAL Take 10 mg by mouth daily with breakfast. What changed:  Another medication with the same name was added. Make sure you understand how and when to take each.   alfuzosin 10 MG 24 hr tablet Commonly known as:  UROXATRAL Take 1 tablet (10 mg total) by mouth daily with breakfast. Start taking on:  09/27/2016 What  changed:  You were already taking a medication with the same name, and this prescription was added. Make sure you understand how and when to take each.   aspirin EC 81 MG tablet Take 81 mg by mouth daily.   busPIRone 15 MG tablet Commonly known as:  BUSPAR Take 7.5-15 mg by mouth 2 (two) times daily as needed (for anxiety).   diltiazem 180 MG 24 hr capsule Commonly known as:  DILACOR XR Take 180 mg by mouth at bedtime.   diltiazem 60 MG tablet Commonly known as:  CARDIZEM Take 60 mg by mouth daily as needed (for heart palpitations/flutter).   diphenhydramine-acetaminophen 25-500 MG Tabs tablet Commonly known as:  TYLENOL PM Take 1 tablet by mouth at bedtime as needed.   fluticasone 50 MCG/ACT nasal spray Commonly known as:  FLONASE Place 1 spray into both nostrils daily as needed (for allergies.).   HYDROcodone-acetaminophen 5-325 MG tablet Commonly known as:  NORCO/VICODIN Take 1 tablet by mouth every 6 (six) hours as needed (for lower back pain.).   LINZESS 145 MCG Caps capsule Generic drug:  linaclotide Take 145 mcg by mouth daily before breakfast.   polyethylene glycol packet Commonly known as:  MIRALAX / GLYCOLAX Take 17 g by mouth daily as needed (for constipation).   simvastatin 40 MG tablet Commonly known as:  ZOCOR Take 40 mg by mouth at bedtime.   warfarin 5 MG tablet Commonly known as:  COUMADIN Take 5 mg by mouth at bedtime.      Verbal and written Discharge instructions given to the patient. Wound care per Discharge AVS Follow-up Information    Leotis Pain, MD Follow up.   Specialties:  Vascular Surgery, Radiology, Interventional Cardiology Why:  Two week postop incision check Contact information: [place] [address] [phone]          Signed: Sela Hua, PA-C  09/26/2016, 8:56 AM

## 2016-09-26 NOTE — Plan of Care (Signed)
Pt received dc instructions and prescriptions.  Pt verbalized understanding  Incisions are intact with pulses present

## 2016-10-10 ENCOUNTER — Encounter (INDEPENDENT_AMBULATORY_CARE_PROVIDER_SITE_OTHER): Payer: Self-pay | Admitting: Vascular Surgery

## 2016-10-10 ENCOUNTER — Ambulatory Visit (INDEPENDENT_AMBULATORY_CARE_PROVIDER_SITE_OTHER): Payer: PPO | Admitting: Vascular Surgery

## 2016-10-10 VITALS — BP 140/76 | HR 60 | Resp 17 | Ht 70.0 in | Wt 277.0 lb

## 2016-10-10 DIAGNOSIS — I714 Abdominal aortic aneurysm, without rupture, unspecified: Secondary | ICD-10-CM

## 2016-10-10 DIAGNOSIS — E785 Hyperlipidemia, unspecified: Secondary | ICD-10-CM

## 2016-10-10 DIAGNOSIS — I480 Paroxysmal atrial fibrillation: Secondary | ICD-10-CM | POA: Diagnosis not present

## 2016-10-10 DIAGNOSIS — I10 Essential (primary) hypertension: Secondary | ICD-10-CM

## 2016-10-14 NOTE — Progress Notes (Signed)
Subjective:    Patient ID: Richard Wilkins, male    DOB: 07-Feb-1935, 81 y.o.   MRN: [id number] Chief Complaint  Patient presents with  . Routine Post Op    2 week wound check   The patient presents for his first post-procedure follow up. He is s/p endovascular AAA repair on 09/25/16. His post-operative course has been unremarkable. He is without complaint. He denies any symptoms such as back pain, pulsatile abdominal masses or thrombosis in his extremities. His hypertension is adequately controlled.    Review of Systems  Constitutional: Negative.   HENT: Negative.   Eyes: Negative.   Respiratory: Negative.   Cardiovascular: Negative.   Gastrointestinal: Negative.   Endocrine: Negative.   Genitourinary: Negative.   Musculoskeletal: Negative.   Skin: Negative.   Allergic/Immunologic: Negative.   Neurological: Negative.   Hematological: Negative.   Psychiatric/Behavioral: Negative.       Objective:   Physical Exam  Constitutional: He is oriented to person, place, and time. He appears well-developed and well-nourished.  HENT:  Head: Normocephalic and atraumatic.  Right Ear: External ear normal.  Left Ear: External ear normal.  Eyes: Conjunctivae and EOM are normal. Pupils are equal, round, and reactive to light.  Neck: Normal range of motion.  Cardiovascular: Normal rate, regular rhythm, normal heart sounds and intact distal pulses.   Pulses:      Radial pulses are 2+ on the right side, and 2+ on the left side.       Dorsalis pedis pulses are 2+ on the right side, and 2+ on the left side.       Posterior tibial pulses are 2+ on the right side, and 2+ on the left side.  Pulmonary/Chest: Effort normal and breath sounds normal.  Abdominal: Soft. Bowel sounds are normal.  Musculoskeletal: Normal range of motion. He exhibits no edema.  Neurological: He is alert and oriented to person, place, and time.  Skin: Skin is warm and dry.  Groins: Access sites healed.   Psychiatric: He  has a normal mood and affect. His behavior is normal. Judgment and thought content normal.   BP 140/76 (BP Location: Left Arm)   Pulse 60   Resp 17   Ht 5\' 10"  (1.778 m)   Wt 277 lb (125.6 kg)   BMI 39.75 kg/m   Past Medical History:  Diagnosis Date  . Abdominal aortic aneurysm (AAA) ([place])   . Atrial fibrillation ([place])   . Cancer ([place])   . Chronic back pain   . Colon polyp   . Constipation   . Coronary artery disease   . Elevated lipids   . Hemorrhage   . Hyperlipidemia   . Hypertension   . Myocardial infarction   . Sleep apnea    Social History   Social History  . Marital status: Married    Spouse name: N/A  . Number of children: N/A  . Years of education: N/A   Occupational History  . retired    Social History Main Topics  . Smoking status: Former Smoker    Quit date: 11/11/2002  . Smokeless tobacco: Never Used  . Alcohol use No  . Drug use: No  . Sexual activity: Not on file   Other Topics Concern  . Not on file   Social History Narrative  . No narrative on file   Past Surgical History:  Procedure Laterality Date  . CARDIAC SURGERY    . HEMORRHOID SURGERY    . IRRIGATION AND DEBRIDEMENT HEMATOMA    .  PERIPHERAL VASCULAR CATHETERIZATION N/A 09/25/2016   Procedure: Endovascular Repair/Stent Graft;  Surgeon: Algernon Huxley, MD;  Location: [facility];  Service: Cardiovascular;  Laterality: N/A;   Family History  Problem Relation Age of Onset  . Cancer - Other Brother    No Known Allergies     Assessment & Plan:  The patient presents for his first post-procedure follow up. He is s/p endovascular AAA repair on 09/25/16. His post-operative course has been unremarkable. He is without complaint. He denies any symptoms such as back pain, pulsatile abdominal masses or thrombosis in his extremities. His hypertension is adequately controlled.   1. AAA (abdominal aortic aneurysm) without rupture [facility]) s/p repair on 09/25/16 - New Patient doing  well. The patient is to follow up in one month for an aortic duplex with ABI's. The patient has a stable asymptomatic abdominal aortic aneurysm. I have reviewed the natural history of abdominal aortic aneurysms and the need for continued surveillance with ultrasound or CT scan is mandatory.  I have discussed with the patient at length the risk factors for and pathogenesis of atherosclerotic disease and encouraged a healthy diet, regular exercise regimen and blood pressure / glucose control. Patient was instructed to contact our office with problems in the interim such back pain, pulsatile abdominal masses or thrombosis in her extremities, extremity pain or development of ulcerations.    2. Essential hypertension - Stable Encouraged good control as its slows the progression of atherosclerotic disease  3. Hyperlipidemia, unspecified hyperlipidemia type - Stable Encouraged good control as its slows the progression of atherosclerotic disease  Current Outpatient Prescriptions on File Prior to Visit  Medication Sig Dispense Refill  . acetaminophen (TYLENOL) 500 MG tablet Take 500-1,000 mg by mouth every 6 (six) hours as needed (for pain.).     [name] Kitchen alfuzosin (UROXATRAL) 10 MG 24 hr tablet Take 10 mg by mouth daily with breakfast.    . alfuzosin (UROXATRAL) 10 MG 24 hr tablet Take 1 tablet (10 mg total) by mouth daily with breakfast.    . aspirin EC 81 MG tablet Take 81 mg by mouth daily.    . busPIRone (BUSPAR) 15 MG tablet Take 7.5-15 mg by mouth 2 (two) times daily as needed (for anxiety).     [name] Kitchen diltiazem (CARDIZEM) 60 MG tablet Take 60 mg by mouth daily as needed (for heart palpitations/flutter).     [name] Kitchen diltiazem (DILACOR XR) 180 MG 24 hr capsule Take 180 mg by mouth at bedtime.     . diphenhydramine-acetaminophen (TYLENOL PM) 25-500 MG TABS tablet Take 1 tablet by mouth at bedtime as needed.    . fluticasone (FLONASE) 50 MCG/ACT nasal spray Place 1 spray into both nostrils daily as needed (for  allergies.).     [name] Kitchen HYDROcodone-acetaminophen (NORCO/VICODIN) 5-325 MG tablet Take 1 tablet by mouth every 6 (six) hours as needed (for lower back pain.). 20 tablet 0  . linaclotide (LINZESS) 145 MCG CAPS capsule Take 145 mcg by mouth daily before breakfast.    . polyethylene glycol (MIRALAX / GLYCOLAX) packet Take 17 g by mouth daily as needed (for constipation).    . simvastatin (ZOCOR) 40 MG tablet Take 40 mg by mouth at bedtime.     [name] Kitchen warfarin (COUMADIN) 5 MG tablet Take 5 mg by mouth at bedtime.      No current facility-administered medications on file prior to visit.     There are no Patient Instructions on file for this visit. No Follow-up on file.  Tosha Belgarde A Pearl Bents, PA-C

## 2016-10-17 DIAGNOSIS — R3121 Asymptomatic microscopic hematuria: Secondary | ICD-10-CM | POA: Diagnosis not present

## 2016-10-17 DIAGNOSIS — I2510 Atherosclerotic heart disease of native coronary artery without angina pectoris: Secondary | ICD-10-CM | POA: Diagnosis not present

## 2016-10-17 DIAGNOSIS — E7800 Pure hypercholesterolemia, unspecified: Secondary | ICD-10-CM | POA: Diagnosis not present

## 2016-10-17 DIAGNOSIS — G4733 Obstructive sleep apnea (adult) (pediatric): Secondary | ICD-10-CM | POA: Diagnosis not present

## 2016-10-17 DIAGNOSIS — I480 Paroxysmal atrial fibrillation: Secondary | ICD-10-CM | POA: Diagnosis not present

## 2016-10-17 DIAGNOSIS — I714 Abdominal aortic aneurysm, without rupture: Secondary | ICD-10-CM | POA: Diagnosis not present

## 2016-10-17 DIAGNOSIS — I10 Essential (primary) hypertension: Secondary | ICD-10-CM | POA: Diagnosis not present

## 2016-10-17 DIAGNOSIS — Z6841 Body Mass Index (BMI) 40.0 and over, adult: Secondary | ICD-10-CM | POA: Diagnosis not present

## 2016-10-17 DIAGNOSIS — D692 Other nonthrombocytopenic purpura: Secondary | ICD-10-CM | POA: Diagnosis not present

## 2016-10-25 ENCOUNTER — Ambulatory Visit: Payer: PPO

## 2016-10-28 ENCOUNTER — Ambulatory Visit (INDEPENDENT_AMBULATORY_CARE_PROVIDER_SITE_OTHER): Payer: PPO | Admitting: Vascular Surgery

## 2016-10-28 ENCOUNTER — Ambulatory Visit (INDEPENDENT_AMBULATORY_CARE_PROVIDER_SITE_OTHER): Payer: PPO

## 2016-10-28 ENCOUNTER — Encounter (INDEPENDENT_AMBULATORY_CARE_PROVIDER_SITE_OTHER): Payer: Self-pay | Admitting: Vascular Surgery

## 2016-10-28 VITALS — BP 132/74 | HR 61 | Resp 17 | Ht 70.0 in | Wt 280.0 lb

## 2016-10-28 DIAGNOSIS — E785 Hyperlipidemia, unspecified: Secondary | ICD-10-CM | POA: Diagnosis not present

## 2016-10-28 DIAGNOSIS — I10 Essential (primary) hypertension: Secondary | ICD-10-CM | POA: Diagnosis not present

## 2016-10-28 DIAGNOSIS — I714 Abdominal aortic aneurysm, without rupture, unspecified: Secondary | ICD-10-CM

## 2016-10-28 NOTE — Progress Notes (Signed)
Subjective:    Patient ID: Richard Wilkins, male    DOB: 1935/03/11, 81 y.o.   MRN: [id number] Chief Complaint  Patient presents with  . Re-evaluation    Ultrasound follow up   Patient presents to review vascular studies. He is s/p EVAR on 09/25/16. He underwent an aortic duplex which was notable for an abdominal aortic aneurysm measuring 4.6cm AP x 4.6cm transverse. No endoleak. He denies any symptoms such as back pain, pulsatile abdominal masses or thrombosis in his extremities. His groin access are healed.    Review of Systems  Constitutional: Negative.   HENT: Negative.   Eyes: Negative.   Respiratory: Negative.   Cardiovascular: Negative.   Gastrointestinal: Negative.   Endocrine: Negative.   Genitourinary: Negative.   Musculoskeletal: Negative.   Skin: Negative.   Allergic/Immunologic: Negative.   Neurological: Negative.   Hematological: Negative.   Psychiatric/Behavioral: Negative.        Objective:   Physical Exam  Constitutional: He is oriented to person, place, and time. He appears well-developed and well-nourished.  HENT:  Head: Normocephalic and atraumatic.  Right Ear: External ear normal.  Left Ear: External ear normal.  Eyes: Conjunctivae and EOM are normal. Pupils are equal, round, and reactive to light.  Neck: Normal range of motion.  Cardiovascular: Normal rate, regular rhythm, normal heart sounds and intact distal pulses.   Pulses:      Radial pulses are 2+ on the right side, and 2+ on the left side.       Dorsalis pedis pulses are 2+ on the right side, and 2+ on the left side.       Posterior tibial pulses are 2+ on the right side, and 2+ on the left side.  Pulmonary/Chest: Effort normal and breath sounds normal.  Abdominal: Soft. Bowel sounds are normal.  Musculoskeletal: Normal range of motion. He exhibits no edema.  Neurological: He is alert and oriented to person, place, and time.  Skin: Skin is warm and dry.  Psychiatric: He has a normal mood and  affect. His behavior is normal. Judgment and thought content normal.   BP 132/74 (BP Location: Left Arm)   Pulse 61   Resp 17   Ht 5\' 10"  (1.778 m)   Wt 280 lb (127 kg)   BMI 40.18 kg/m   Past Medical History:  Diagnosis Date  . Abdominal aortic aneurysm (AAA) ([place])   . Atrial fibrillation ([place])   . Cancer ([place])   . Chronic back pain   . Colon polyp   . Constipation   . Coronary artery disease   . Elevated lipids   . Hemorrhage   . Hyperlipidemia   . Hypertension   . Myocardial infarction   . Sleep apnea    Social History   Social History  . Marital status: Married    Spouse name: N/A  . Number of children: N/A  . Years of education: N/A   Occupational History  . retired    Social History Main Topics  . Smoking status: Former Smoker    Quit date: 11/11/2002  . Smokeless tobacco: Never Used  . Alcohol use No  . Drug use: No  . Sexual activity: Not on file   Other Topics Concern  . Not on file   Social History Narrative  . No narrative on file   Past Surgical History:  Procedure Laterality Date  . CARDIAC SURGERY    . HEMORRHOID SURGERY    . IRRIGATION AND DEBRIDEMENT HEMATOMA    .  PERIPHERAL VASCULAR CATHETERIZATION N/A 09/25/2016   Procedure: Endovascular Repair/Stent Graft;  Surgeon: Algernon Huxley, MD;  Location: [facility];  Service: Cardiovascular;  Laterality: N/A;   Family History  Problem Relation Age of Onset  . Cancer - Other Brother    No Known Allergies     Assessment & Plan:  Patient presents to review vascular studies. He is s/p EVAR on 09/25/16. He underwent an aortic duplex which was notable for an abdominal aortic aneurysm measuring 4.6cm AP x 4.6cm transverse. No endoleak. He denies any symptoms such as back pain, pulsatile abdominal masses or thrombosis in his extremities. His groin access are healed.   1. AAA (abdominal aortic aneurysm) without rupture [facility]) s/p repair on 09/25/16 - Improved Studies reviewed with  patient. Duplex improved with no leak, physical exam unremarkable. No surgery or intervention at this time. The patient to follow up in three months with an EVAR and ABI. The patient has a stable asymptomatic abdominal aortic aneurysm. I have reviewed the natural history of abdominal aortic aneurysms and the need for continued surveillance with ultrasound or CT scan is mandatory.  I have discussed with the patient at length the risk factors for and pathogenesis of atherosclerotic disease and encouraged a healthy diet, regular exercise regimen and blood pressure / glucose control. Patient was instructed to contact our office with problems in the interim such back pain, pulsatile abdominal masses or thrombosis in her extremities, extremity pain or development of ulcerations.    2. Hyperlipidemia, unspecified hyperlipidemia type - Stable Encouraged good control as its slows the progression of atherosclerotic disease  3. Essential hypertension - Stable Encouraged good control as its slows the progression of atherosclerotic disease  Current Outpatient Prescriptions on File Prior to Visit  Medication Sig Dispense Refill  . acetaminophen (TYLENOL) 500 MG tablet Take 500-1,000 mg by mouth every 6 (six) hours as needed (for pain.).     [name] Kitchen alfuzosin (UROXATRAL) 10 MG 24 hr tablet Take 10 mg by mouth daily with breakfast.    . alfuzosin (UROXATRAL) 10 MG 24 hr tablet Take 1 tablet (10 mg total) by mouth daily with breakfast.    . aspirin EC 81 MG tablet Take 81 mg by mouth daily.    . busPIRone (BUSPAR) 15 MG tablet Take 7.5-15 mg by mouth 2 (two) times daily as needed (for anxiety).     [name] Kitchen diltiazem (CARDIZEM) 60 MG tablet Take 60 mg by mouth daily as needed (for heart palpitations/flutter).     [name] Kitchen diltiazem (CARTIA XT) 180 MG 24 hr capsule TAKE ONE CAPSULE BY MOUTH ONCE DAILY    . diltiazem (DILACOR XR) 180 MG 24 hr capsule Take 180 mg by mouth at bedtime.     . diphenhydramine-acetaminophen (TYLENOL PM)  25-500 MG TABS tablet Take 1 tablet by mouth at bedtime as needed.    Mariane Baumgarten Sodium 100 MG capsule Take by mouth.    . ferrous sulfate 325 (65 FE) MG tablet Take by mouth.    . fluticasone (FLONASE) 50 MCG/ACT nasal spray Place 1 spray into both nostrils daily as needed (for allergies.).     [name] Kitchen HYDROcodone-acetaminophen (NORCO/VICODIN) 5-325 MG tablet Take 1 tablet by mouth every 6 (six) hours as needed (for lower back pain.). 20 tablet 0  . linaclotide (LINZESS) 145 MCG CAPS capsule Take 145 mcg by mouth daily before breakfast.    . linaclotide (LINZESS) 145 MCG CAPS capsule Take by mouth.    . polyethylene glycol (MIRALAX /  GLYCOLAX) packet Take 17 g by mouth daily as needed (for constipation).    Orlie Dakin Sodium 8.6-50 MG CAPS Take by mouth.    . simvastatin (ZOCOR) 40 MG tablet Take 40 mg by mouth at bedtime.     [name] Kitchen warfarin (COUMADIN) 5 MG tablet Take 5 mg by mouth at bedtime.      No current facility-administered medications on file prior to visit.     There are no Patient Instructions on file for this visit. Return in about 3 months (around 01/26/2017) for Three Month EVAR Follow Up.   KIMBERLY A STEGMAYER, PA-C

## 2016-11-07 DIAGNOSIS — I4891 Unspecified atrial fibrillation: Secondary | ICD-10-CM | POA: Diagnosis not present

## 2016-11-18 ENCOUNTER — Ambulatory Visit: Payer: PPO | Admitting: Urology

## 2016-11-18 ENCOUNTER — Encounter: Payer: Self-pay | Admitting: Urology

## 2016-11-18 VITALS — BP 126/87 | HR 111 | Ht 70.0 in | Wt 281.0 lb

## 2016-11-18 DIAGNOSIS — N281 Cyst of kidney, acquired: Secondary | ICD-10-CM | POA: Diagnosis not present

## 2016-11-18 DIAGNOSIS — N401 Enlarged prostate with lower urinary tract symptoms: Secondary | ICD-10-CM

## 2016-11-18 DIAGNOSIS — N138 Other obstructive and reflux uropathy: Secondary | ICD-10-CM

## 2016-11-18 DIAGNOSIS — R310 Gross hematuria: Secondary | ICD-10-CM

## 2016-11-18 LAB — URINALYSIS, COMPLETE
BILIRUBIN UA: NEGATIVE
GLUCOSE, UA: NEGATIVE
KETONES UA: NEGATIVE
Nitrite, UA: NEGATIVE
PROTEIN UA: NEGATIVE
SPEC GRAV UA: 1.01 (ref 1.005–1.030)
UUROB: 0.2 mg/dL (ref 0.2–1.0)
pH, UA: 6 (ref 5.0–7.5)

## 2016-11-18 LAB — MICROSCOPIC EXAMINATION
Bacteria, UA: NONE SEEN
Epithelial Cells (non renal): NONE SEEN /hpf (ref 0–10)

## 2016-11-18 NOTE — Progress Notes (Signed)
11/18/2016 6:17 AM   Richard Wilkins 02/09/1935 [id number]  Referring provider: Adin Hector, MD [place], [place] [id number]  CC: new patient for gross hematuria  HPI:  1. Gross hematuria - transient gross hematuria after endovascular AAA repair 10/2016 and catheter placmen and microblood on UA x several. . Contrast CT abd/pelvix 10/2016 w/o stones or worrisome upper tract masses. Known large prostate as per below. He is on chronic coumadin for AFib and vascular disease. Remote 30PY smoker.   2. Enlarged prostate with urinary obstruction - on alfuzosin x years for obstrucitve symtpoms with excellent symptom conrol, now minimal bother. Prostate Vol 132mL by CT 10/2016. DRE 11/2016 100gm smooth.   3. Renal cysts, acquired, bilateral - bilateraal 5cm lateral mid non-complex cysts by contrast CT 10/2016. NO enhanceing nodules / mass effect / or coarse calcifications.  PMH sig for AAA/EVAR, AFib/Coumadin, Significant obesity. His PCP is  Ramonita Lab MD.   Today "Richard Wilkins" is seen as new patient for above.    PMH: Past Medical History:  Diagnosis Date  . Abdominal aortic aneurysm (AAA) ([place])   . Atrial fibrillation ([place])   . Cancer ([place])   . Chronic back pain   . Colon polyp   . Constipation   . Coronary artery disease   . Elevated lipids   . Hemorrhage   . Hyperlipidemia   . Hypertension   . Myocardial infarction   . Sleep apnea     Surgical History: Past Surgical History:  Procedure Laterality Date  . CARDIAC SURGERY    . HEMORRHOID SURGERY    . IRRIGATION AND DEBRIDEMENT HEMATOMA    . PERIPHERAL VASCULAR CATHETERIZATION N/A 09/25/2016   Procedure: Endovascular Repair/Stent Graft;  Surgeon: Algernon Huxley, MD;  Location: [facility];  Service: Cardiovascular;  Laterality: N/A;    Home Medications:  Allergies as of 11/18/2016   No Known Allergies     Medication List       Accurate as of 11/18/16  6:17 AM. Always use your  most recent med list.          acetaminophen 500 MG tablet Commonly known as:  TYLENOL Take 500-1,000 mg by mouth every 6 (six) hours as needed (for pain.).   alfuzosin 10 MG 24 hr tablet Commonly known as:  UROXATRAL Take 10 mg by mouth daily with breakfast.   alfuzosin 10 MG 24 hr tablet Commonly known as:  UROXATRAL Take 1 tablet (10 mg total) by mouth daily with breakfast.   aspirin EC 81 MG tablet Take 81 mg by mouth daily.   busPIRone 15 MG tablet Commonly known as:  BUSPAR Take 7.5-15 mg by mouth 2 (two) times daily as needed (for anxiety).   CARTIA XT 180 MG 24 hr capsule Generic drug:  diltiazem TAKE ONE CAPSULE BY MOUTH ONCE DAILY   diltiazem 180 MG 24 hr capsule Commonly known as:  DILACOR XR Take 180 mg by mouth at bedtime.   diltiazem 60 MG tablet Commonly known as:  CARDIZEM Take 60 mg by mouth daily as needed (for heart palpitations/flutter).   diphenhydramine-acetaminophen 25-500 MG Tabs tablet Commonly known as:  TYLENOL PM Take 1 tablet by mouth at bedtime as needed.   Docusate Sodium 100 MG capsule Take by mouth.   ferrous sulfate 325 (65 FE) MG tablet Take by mouth.   fluticasone 50 MCG/ACT nasal spray Commonly known as:  FLONASE Place 1 spray into both nostrils daily as needed (  for allergies.).   HYDROcodone-acetaminophen 5-325 MG tablet Commonly known as:  NORCO/VICODIN Take 1 tablet by mouth every 6 (six) hours as needed (for lower back pain.).   LINZESS 145 MCG Caps capsule Generic drug:  linaclotide Take 145 mcg by mouth daily before breakfast.   linaclotide 145 MCG Caps capsule Commonly known as:  LINZESS Take by mouth.   polyethylene glycol packet Commonly known as:  MIRALAX / GLYCOLAX Take 17 g by mouth daily as needed (for constipation).   Sennosides-Docusate Sodium 8.6-50 MG Caps Take by mouth.   simvastatin 40 MG tablet Commonly known as:  ZOCOR Take 40 mg by mouth at bedtime.   warfarin 5 MG tablet Commonly  known as:  COUMADIN Take 5 mg by mouth at bedtime.       Allergies: No Known Allergies  Family History: Family History  Problem Relation Age of Onset  . Cancer - Other Brother     Social History:  reports that he quit smoking about 14 years ago. He has never used smokeless tobacco. He reports that he does not drink alcohol or use drugs.   Review of Systems  Gastrointestinal (upper)  : Negative for upper GI symptoms  Gastrointestinal (lower) : Negative for lower GI symptoms  Constitutional : Negative for symptoms  Skin: Negative for skin symptoms  Eyes: Negative for eye symptoms  Ear/Nose/Throat : Negative for Ear/Nose/Throat symptoms  Hematologic/Lymphatic: Negative for Hematologic/Lymphatic symptoms  Cardiovascular : Negative for cardiovascular symptoms  Respiratory : Negative for respiratory symptoms  Endocrine: Negative for endocrine symptoms  Musculoskeletal: Negative for musculoskeletal symptoms  Neurological: Negative for neurological symptoms  Psychologic: Negative for psychiatric symptoms   Physical Exam: There were no vitals taken for this visit.  Constitutional:  Alert and oriented, No acute distress. HEENT: [place] AT, moist mucus membranes.  Trachea midline, no masses. Cardiovascular: No clubbing, cyanosis, or edema. Respiratory: Normal respiratory effort, no increased work of breathing. GI: Abdomen is soft, nontender, nondistended, no abdominal masses. Morbid truncal obesity.  GU: No CVA tenderness. DRE 100gm smooth. Buried Penis.  Skin: No rashes, bruises or suspicious lesions. Lymph: No cervical or inguinal adenopathy. Neurologic: Grossly intact, no focal deficits, moving all 4 extremities. Psychiatric: Normal mood and affect.  Laboratory Data: Lab Results  Component Value Date   WBC 13.9 (H) 09/26/2016   HGB 13.8 09/26/2016   HCT 40.2 09/26/2016   MCV 97.2 09/26/2016   PLT 217 09/26/2016    Lab Results  Component Value Date    CREATININE 0.64 09/26/2016    No results found for: PSA  No results found for: TESTOSTERONE  No results found for: HGBA1C  Urinalysis    Component Value Date/Time   COLORURINE YELLOW (A) 08/08/2016 2156   APPEARANCEUR CLEAR (A) 08/08/2016 2156   LABSPEC 1.017 08/08/2016 2156   PHURINE 6.0 08/08/2016 2156   GLUCOSEU NEGATIVE 08/08/2016 2156   HGBUR NEGATIVE 08/08/2016 2156   BILIRUBINUR NEGATIVE 08/08/2016 2156   [place] NEGATIVE 08/08/2016 2156   PROTEINUR NEGATIVE 08/08/2016 2156   NITRITE NEGATIVE 08/08/2016 2156   LEUKOCYTESUR TRACE (A) 08/08/2016 2156    Pertinent Imaging: As per abvoe.   Assessment & Plan:    1. Gross hematuria - likely due to large prostate in setting of coumadin use and recent instrumentation. CT otherwise reassuring. Rec complete eval with cysto on return. Low threshold for finasteride for this and below if becomes recurrrent.   2. Enlarged prostate with urinary obstruction - likely BPH by history, imaging, exam. Continue alfuzosin  and consider adding  finasteride should bother progress as that is single ebst medical agent to help both with obstructive symptoms and prostate source bleeding. At this point we both agree to hold off on addiitional meds.   3. Renal cysts, acquired, bilateral - no indication for further evaluation, observe.   RTC for cysto to complete hematuria eval.   Alexis Frock, [place] [address] [place], [place] [address] [phone]

## 2016-12-05 DIAGNOSIS — I4891 Unspecified atrial fibrillation: Secondary | ICD-10-CM | POA: Diagnosis not present

## 2016-12-10 ENCOUNTER — Ambulatory Visit: Payer: PPO | Admitting: Urology

## 2016-12-10 ENCOUNTER — Encounter: Payer: Self-pay | Admitting: Urology

## 2016-12-10 VITALS — BP 122/74 | HR 80 | Ht 70.0 in | Wt 289.1 lb

## 2016-12-10 DIAGNOSIS — R310 Gross hematuria: Secondary | ICD-10-CM

## 2016-12-10 MED ORDER — CIPROFLOXACIN HCL 500 MG PO TABS
500.0000 mg | ORAL_TABLET | Freq: Once | ORAL | Status: AC
  Administered 2016-12-10: 500 mg via ORAL

## 2016-12-10 MED ORDER — LIDOCAINE HCL 2 % EX GEL
1.0000 "application " | Freq: Once | CUTANEOUS | Status: AC
  Administered 2016-12-10: 1 via URETHRAL

## 2016-12-10 NOTE — Progress Notes (Signed)
   12/10/16  CC:  Chief Complaint  Patient presents with  . Cysto    HPI:  Blood pressure 122/74, pulse 80, height 5\' 10"  (1.778 m), weight 131.1 kg (289 lb 1.6 oz). NED. A&Ox3.   No respiratory distress   Abd soft, NT, ND Normal phallus with bilateral descended testicles  Cystoscopy Procedure Note  Patient identification was confirmed, informed consent was obtained, and patient was prepped using Betadine solution.  Lidocaine jelly was administered per urethral meatus.    Preoperative abx where received prior to procedure.     Pre-Procedure: - Inspection reveals a normal caliber ureteral meatus.  Procedure: The flexible cystoscope was introduced without difficulty - No urethral strictures/lesions are present. - Enlarged prostate 4.5cm - Elevated bladder neck - Bilateral ureteral orifices identified - Bladder mucosa  reveals no ulcers, tumors, or lesions - No bladder stones - No trabeculation  Retroflexion shows moderate intravesical protrusion.   Post-Procedure: - Patient tolerated the procedure well  Assessment/ Plan:  The patient's hematuria evaluation is largely unremarkable. As previously presumed, the microscopic hematuria is a most certainly associated with instrumentation of his urinary tract on Coumadin. His voiding symptoms are otherwise well controlled on all views in. If he does develop some progression of his symptoms or recurrent gross hematuria, finasteride would be the logical next step. However, at this point the patient does not need to follow up with [place]. We will follow-up with him on an as-needed basis.  Cc: Dr. Ramonita Lab, M.D.

## 2016-12-11 LAB — URINALYSIS, COMPLETE
Bilirubin, UA: NEGATIVE
GLUCOSE, UA: NEGATIVE
NITRITE UA: NEGATIVE
PH UA: 7 (ref 5.0–7.5)
Specific Gravity, UA: 1.02 (ref 1.005–1.030)
Urobilinogen, Ur: 1 mg/dL (ref 0.2–1.0)

## 2016-12-11 LAB — MICROSCOPIC EXAMINATION: Bacteria, UA: NONE SEEN

## 2016-12-13 DIAGNOSIS — G4733 Obstructive sleep apnea (adult) (pediatric): Secondary | ICD-10-CM | POA: Diagnosis not present

## 2016-12-18 DIAGNOSIS — G4733 Obstructive sleep apnea (adult) (pediatric): Secondary | ICD-10-CM | POA: Diagnosis not present

## 2016-12-20 DIAGNOSIS — Z7901 Long term (current) use of anticoagulants: Secondary | ICD-10-CM | POA: Diagnosis not present

## 2017-01-09 ENCOUNTER — Other Ambulatory Visit (INDEPENDENT_AMBULATORY_CARE_PROVIDER_SITE_OTHER): Payer: PPO

## 2017-01-09 ENCOUNTER — Encounter (INDEPENDENT_AMBULATORY_CARE_PROVIDER_SITE_OTHER): Payer: Self-pay

## 2017-01-09 ENCOUNTER — Ambulatory Visit (INDEPENDENT_AMBULATORY_CARE_PROVIDER_SITE_OTHER): Payer: PPO | Admitting: Vascular Surgery

## 2017-01-09 DIAGNOSIS — I714 Abdominal aortic aneurysm, without rupture, unspecified: Secondary | ICD-10-CM

## 2017-01-15 DIAGNOSIS — Z7901 Long term (current) use of anticoagulants: Secondary | ICD-10-CM | POA: Diagnosis not present

## 2017-01-27 DIAGNOSIS — X32XXXA Exposure to sunlight, initial encounter: Secondary | ICD-10-CM | POA: Diagnosis not present

## 2017-01-27 DIAGNOSIS — D225 Melanocytic nevi of trunk: Secondary | ICD-10-CM | POA: Diagnosis not present

## 2017-01-27 DIAGNOSIS — D2272 Melanocytic nevi of left lower limb, including hip: Secondary | ICD-10-CM | POA: Diagnosis not present

## 2017-01-27 DIAGNOSIS — Z85828 Personal history of other malignant neoplasm of skin: Secondary | ICD-10-CM | POA: Diagnosis not present

## 2017-01-27 DIAGNOSIS — D2261 Melanocytic nevi of right upper limb, including shoulder: Secondary | ICD-10-CM | POA: Diagnosis not present

## 2017-01-27 DIAGNOSIS — L570 Actinic keratosis: Secondary | ICD-10-CM | POA: Diagnosis not present

## 2017-01-29 ENCOUNTER — Encounter (INDEPENDENT_AMBULATORY_CARE_PROVIDER_SITE_OTHER): Payer: Self-pay

## 2017-01-29 ENCOUNTER — Ambulatory Visit (INDEPENDENT_AMBULATORY_CARE_PROVIDER_SITE_OTHER): Payer: PPO

## 2017-01-29 ENCOUNTER — Ambulatory Visit (INDEPENDENT_AMBULATORY_CARE_PROVIDER_SITE_OTHER): Payer: PPO | Admitting: Vascular Surgery

## 2017-01-29 VITALS — BP 150/80 | HR 61 | Resp 18 | Ht 70.0 in | Wt 289.0 lb

## 2017-01-29 DIAGNOSIS — I714 Abdominal aortic aneurysm, without rupture, unspecified: Secondary | ICD-10-CM

## 2017-01-29 DIAGNOSIS — I10 Essential (primary) hypertension: Secondary | ICD-10-CM | POA: Diagnosis not present

## 2017-01-29 DIAGNOSIS — E785 Hyperlipidemia, unspecified: Secondary | ICD-10-CM | POA: Diagnosis not present

## 2017-02-10 NOTE — Progress Notes (Signed)
Subjective:    Patient ID: Richard Wilkins, male    DOB: 03-30-1935, 82 y.o.   MRN: [id number] Chief Complaint  Patient presents with  . Re-evaluation    Ultrasound results   The patient presents for a three month AAA follow up. He is s/p endovascular AAA repair on 09-25-16. He underwent an aortic duplex which was notable for an abdominal aortic aneurysm measuring 4.74cm AP x 4.7cm transverse. No endoleak. He denies any symptoms such as back pain, pulsatile abdominal masses or thrombosis in his extremities. His hypertension is adequately controlled.    Review of Systems  All other systems reviewed and are negative.     Objective:   Physical Exam  Constitutional: He is oriented to person, place, and time. He appears well-developed and well-nourished.  HENT:  Head: Normocephalic and atraumatic.  Eyes: Conjunctivae are normal. Pupils are equal, round, and reactive to light.  Neck: Normal range of motion.  Cardiovascular: Normal rate, regular rhythm, normal heart sounds and intact distal pulses.   Pulses:      Radial pulses are 2+ on the right side, and 2+ on the left side.       Dorsalis pedis pulses are 2+ on the right side, and 2+ on the left side.       Posterior tibial pulses are 2+ on the right side, and 2+ on the left side.  Pulmonary/Chest: Effort normal.  Musculoskeletal: Normal range of motion. He exhibits no edema.  Neurological: He is alert and oriented to person, place, and time.  Skin: Skin is warm and dry.  Psychiatric: He has a normal mood and affect. His behavior is normal. Judgment and thought content normal.  Vitals reviewed.  BP (!) 150/80 (BP Location: Right Arm)   Pulse 61   Resp 18   Ht 5\' 10"  (1.778 m)   Wt 289 lb (131.1 kg)   BMI 41.47 kg/m   Past Medical History:  Diagnosis Date  . Abdominal aortic aneurysm (AAA) ([place])   . Atrial fibrillation ([place])   . Cancer ([place])   . Chronic back pain   . Colon polyp   . Constipation   . Coronary artery disease    . Elevated lipids   . Hemorrhage   . Hyperlipidemia   . Hypertension   . Myocardial infarction   . Sleep apnea    Social History   Social History  . Marital status: Married    Spouse name: N/A  . Number of children: N/A  . Years of education: N/A   Occupational History  . retired    Social History Main Topics  . Smoking status: Former Smoker    Quit date: 11/11/2002  . Smokeless tobacco: Never Used  . Alcohol use No  . Drug use: No  . Sexual activity: Not on file   Other Topics Concern  . Not on file   Social History Narrative  . No narrative on file   Past Surgical History:  Procedure Laterality Date  . CARDIAC SURGERY    . HEMORRHOID SURGERY    . IRRIGATION AND DEBRIDEMENT HEMATOMA    . PERIPHERAL VASCULAR CATHETERIZATION N/A 09/25/2016   Procedure: Endovascular Repair/Stent Graft;  Surgeon: Algernon Huxley, MD;  Location: [facility];  Service: Cardiovascular;  Laterality: N/A;   Family History  Problem Relation Age of Onset  . Cancer - Other Brother   . Prostate cancer Neg Hx   . Bladder Cancer Neg Hx   . Kidney cancer Neg  Hx    No Known Allergies     Assessment & Plan:  The patient presents for a three month AAA follow up. He is s/p endovascular AAA repair on 09-25-16. He underwent an aortic duplex which was notable for an abdominal aortic aneurysm measuring 4.74cm AP x 4.7cm transverse. No endoleak. He denies any symptoms such as back pain, pulsatile abdominal masses or thrombosis in his extremities. His hypertension is adequately controlled.   1. AAA (abdominal aortic aneurysm) without rupture (HCC) - Stable Studies reviewed with patient. Duplex stable with no leak, physical exam unremarkable. No surgery or intervention at this time. The patient to follow up in six months with an aortic duplex and ABI. Patient was instructed to contact our office with problems in the interim such back pain, pulsatile abdominal masses or thrombosis in his  extremities, extremity pain or development of ulcerations.    - VAS [place] ABI WITH/WO TBI; Future - VAS [place] EVAR DUPLEX; Future  2. Hyperlipidemia, unspecified hyperlipidemia type - Stable Encouraged good control as its slows the progression of atherosclerotic disease  3. Essential hypertension - Stable Encouraged good control as its slows the progression of atherosclerotic disease  Current Outpatient Prescriptions on File Prior to Visit  Medication Sig Dispense Refill  . acetaminophen (TYLENOL) 500 MG tablet Take 500-1,000 mg by mouth every 6 (six) hours as needed (for pain.).     [name] Kitchen alfuzosin (UROXATRAL) 10 MG 24 hr tablet Take 1 tablet (10 mg total) by mouth daily with breakfast.    . aspirin EC 81 MG tablet Take 81 mg by mouth daily.    . busPIRone (BUSPAR) 15 MG tablet Take 7.5-15 mg by mouth 2 (two) times daily as needed (for anxiety).     [name] Kitchen diltiazem (CARDIZEM) 60 MG tablet Take 60 mg by mouth daily as needed (for heart palpitations/flutter).     [name] Kitchen diltiazem (CARTIA XT) 180 MG 24 hr capsule TAKE ONE CAPSULE BY MOUTH ONCE DAILY    . diphenhydramine-acetaminophen (TYLENOL PM) 25-500 MG TABS tablet Take 1 tablet by mouth at bedtime as needed.    Mariane Baumgarten Sodium 100 MG capsule Take by mouth.    . ferrous sulfate 325 (65 FE) MG tablet Take by mouth.    . fluticasone (FLONASE) 50 MCG/ACT nasal spray Place 1 spray into both nostrils daily as needed (for allergies.).     [name] Kitchen HYDROcodone-acetaminophen (NORCO/VICODIN) 5-325 MG tablet Take 1 tablet by mouth every 6 (six) hours as needed (for lower back pain.). 20 tablet 0  . linaclotide (LINZESS) 145 MCG CAPS capsule Take 145 mcg by mouth daily before breakfast.    . linaclotide (LINZESS) 145 MCG CAPS capsule Take by mouth.    . polyethylene glycol (MIRALAX / GLYCOLAX) packet Take 17 g by mouth daily as needed (for constipation).    Orlie Dakin Sodium 8.6-50 MG CAPS Take by mouth.    . simvastatin (ZOCOR) 40 MG tablet TAKE ONE TABLET BY  MOUTH AT BEDTIME    . warfarin (COUMADIN) 5 MG tablet Take 5 mg by mouth at bedtime.      No current facility-administered medications on file prior to visit.     There are no Patient Instructions on file for this visit. No Follow-up on file.   Shimeka Bacot A Kyzen Horn, PA-C

## 2017-02-12 DIAGNOSIS — Z7901 Long term (current) use of anticoagulants: Secondary | ICD-10-CM | POA: Diagnosis not present

## 2017-02-20 DIAGNOSIS — I10 Essential (primary) hypertension: Secondary | ICD-10-CM | POA: Diagnosis not present

## 2017-02-20 DIAGNOSIS — E785 Hyperlipidemia, unspecified: Secondary | ICD-10-CM | POA: Diagnosis not present

## 2017-02-20 DIAGNOSIS — I2510 Atherosclerotic heart disease of native coronary artery without angina pectoris: Secondary | ICD-10-CM | POA: Diagnosis not present

## 2017-02-20 DIAGNOSIS — R0602 Shortness of breath: Secondary | ICD-10-CM | POA: Diagnosis not present

## 2017-02-20 DIAGNOSIS — G4730 Sleep apnea, unspecified: Secondary | ICD-10-CM | POA: Diagnosis not present

## 2017-02-20 DIAGNOSIS — I714 Abdominal aortic aneurysm, without rupture: Secondary | ICD-10-CM | POA: Diagnosis not present

## 2017-02-20 DIAGNOSIS — I480 Paroxysmal atrial fibrillation: Secondary | ICD-10-CM | POA: Diagnosis not present

## 2017-02-20 DIAGNOSIS — Z9889 Other specified postprocedural states: Secondary | ICD-10-CM | POA: Diagnosis not present

## 2017-02-26 DIAGNOSIS — R791 Abnormal coagulation profile: Secondary | ICD-10-CM | POA: Diagnosis not present

## 2017-03-12 DIAGNOSIS — I480 Paroxysmal atrial fibrillation: Secondary | ICD-10-CM | POA: Diagnosis not present

## 2017-03-24 DIAGNOSIS — G4733 Obstructive sleep apnea (adult) (pediatric): Secondary | ICD-10-CM | POA: Diagnosis not present

## 2017-04-10 DIAGNOSIS — Z7901 Long term (current) use of anticoagulants: Secondary | ICD-10-CM | POA: Diagnosis not present

## 2017-04-22 DIAGNOSIS — I10 Essential (primary) hypertension: Secondary | ICD-10-CM | POA: Diagnosis not present

## 2017-04-22 DIAGNOSIS — R791 Abnormal coagulation profile: Secondary | ICD-10-CM | POA: Diagnosis not present

## 2017-04-22 DIAGNOSIS — I480 Paroxysmal atrial fibrillation: Secondary | ICD-10-CM | POA: Diagnosis not present

## 2017-04-29 DIAGNOSIS — I482 Chronic atrial fibrillation, unspecified: Secondary | ICD-10-CM | POA: Insufficient documentation

## 2017-04-29 DIAGNOSIS — G4730 Sleep apnea, unspecified: Secondary | ICD-10-CM | POA: Diagnosis not present

## 2017-04-29 DIAGNOSIS — D692 Other nonthrombocytopenic purpura: Secondary | ICD-10-CM | POA: Diagnosis not present

## 2017-04-29 DIAGNOSIS — I739 Peripheral vascular disease, unspecified: Secondary | ICD-10-CM | POA: Diagnosis not present

## 2017-04-29 DIAGNOSIS — E785 Hyperlipidemia, unspecified: Secondary | ICD-10-CM | POA: Diagnosis not present

## 2017-04-29 DIAGNOSIS — Z0000 Encounter for general adult medical examination without abnormal findings: Secondary | ICD-10-CM | POA: Diagnosis not present

## 2017-04-29 DIAGNOSIS — I10 Essential (primary) hypertension: Secondary | ICD-10-CM | POA: Diagnosis not present

## 2017-04-29 DIAGNOSIS — I2510 Atherosclerotic heart disease of native coronary artery without angina pectoris: Secondary | ICD-10-CM | POA: Diagnosis not present

## 2017-05-06 DIAGNOSIS — Z7901 Long term (current) use of anticoagulants: Secondary | ICD-10-CM | POA: Diagnosis not present

## 2017-06-03 DIAGNOSIS — Z7901 Long term (current) use of anticoagulants: Secondary | ICD-10-CM | POA: Diagnosis not present

## 2017-06-26 DIAGNOSIS — I482 Chronic atrial fibrillation: Secondary | ICD-10-CM | POA: Diagnosis not present

## 2017-06-26 DIAGNOSIS — Z9889 Other specified postprocedural states: Secondary | ICD-10-CM | POA: Diagnosis not present

## 2017-06-26 DIAGNOSIS — I10 Essential (primary) hypertension: Secondary | ICD-10-CM | POA: Diagnosis not present

## 2017-06-26 DIAGNOSIS — I739 Peripheral vascular disease, unspecified: Secondary | ICD-10-CM | POA: Diagnosis not present

## 2017-06-26 DIAGNOSIS — E785 Hyperlipidemia, unspecified: Secondary | ICD-10-CM | POA: Diagnosis not present

## 2017-06-26 DIAGNOSIS — I2510 Atherosclerotic heart disease of native coronary artery without angina pectoris: Secondary | ICD-10-CM | POA: Diagnosis not present

## 2017-06-26 DIAGNOSIS — R0602 Shortness of breath: Secondary | ICD-10-CM | POA: Diagnosis not present

## 2017-07-03 DIAGNOSIS — Z7901 Long term (current) use of anticoagulants: Secondary | ICD-10-CM | POA: Diagnosis not present

## 2017-07-07 DIAGNOSIS — L570 Actinic keratosis: Secondary | ICD-10-CM | POA: Diagnosis not present

## 2017-07-07 DIAGNOSIS — L821 Other seborrheic keratosis: Secondary | ICD-10-CM | POA: Diagnosis not present

## 2017-07-07 DIAGNOSIS — G4733 Obstructive sleep apnea (adult) (pediatric): Secondary | ICD-10-CM | POA: Diagnosis not present

## 2017-07-11 ENCOUNTER — Ambulatory Visit (INDEPENDENT_AMBULATORY_CARE_PROVIDER_SITE_OTHER): Payer: PPO | Admitting: Vascular Surgery

## 2017-07-11 ENCOUNTER — Other Ambulatory Visit (INDEPENDENT_AMBULATORY_CARE_PROVIDER_SITE_OTHER): Payer: PPO

## 2017-08-01 ENCOUNTER — Encounter (INDEPENDENT_AMBULATORY_CARE_PROVIDER_SITE_OTHER): Payer: Self-pay

## 2017-08-01 ENCOUNTER — Encounter (INDEPENDENT_AMBULATORY_CARE_PROVIDER_SITE_OTHER): Payer: Self-pay | Admitting: Vascular Surgery

## 2017-08-01 ENCOUNTER — Ambulatory Visit (INDEPENDENT_AMBULATORY_CARE_PROVIDER_SITE_OTHER): Payer: PPO | Admitting: Vascular Surgery

## 2017-08-01 ENCOUNTER — Ambulatory Visit (INDEPENDENT_AMBULATORY_CARE_PROVIDER_SITE_OTHER): Payer: PPO

## 2017-08-01 VITALS — BP 118/68 | HR 68 | Resp 16 | Wt 275.6 lb

## 2017-08-01 DIAGNOSIS — I714 Abdominal aortic aneurysm, without rupture, unspecified: Secondary | ICD-10-CM

## 2017-08-01 DIAGNOSIS — E785 Hyperlipidemia, unspecified: Secondary | ICD-10-CM

## 2017-08-01 DIAGNOSIS — I10 Essential (primary) hypertension: Secondary | ICD-10-CM

## 2017-08-01 NOTE — Progress Notes (Signed)
MRN : [id number]  Richard Wilkins is a 82 y.o. (15-Aug-1935) male who presents with chief complaint of  Chief Complaint  Patient presents with  . Follow-up    90mo abi,evar  .  History of Present Illness: Patient returns today in follow up of AAA. He is doing well today without specific complaints. He denies any aneurysm related symptoms. Specifically, the patient denies new back or abdominal pain, or signs of peripheral embolization. His aneurysm duplex today reveals no endoleak with a patent stent graft and a maximal sac diameter measuring about 4.7 cm which is similar to his previous visit about 6 months ago. He is now almost 1 year status post endovascular repair.  Current Outpatient Prescriptions  Medication Sig Dispense Refill  . acetaminophen (TYLENOL) 500 MG tablet Take 500-1,000 mg by mouth every 6 (six) hours as needed (for pain.).     [name] Kitchen alfuzosin (UROXATRAL) 10 MG 24 hr tablet Take 1 tablet (10 mg total) by mouth daily with breakfast.    . aspirin EC 81 MG tablet Take 81 mg by mouth daily.    . busPIRone (BUSPAR) 15 MG tablet Take 7.5-15 mg by mouth 2 (two) times daily as needed (for anxiety).     [name] Kitchen diltiazem (CARDIZEM) 60 MG tablet Take 60 mg by mouth daily as needed (for heart palpitations/flutter).     [name] Kitchen diltiazem (CARTIA XT) 180 MG 24 hr capsule TAKE ONE CAPSULE BY MOUTH ONCE DAILY    . fluticasone (FLONASE) 50 MCG/ACT nasal spray Place 1 spray into both nostrils daily as needed (for allergies.).     [name] Kitchen HYDROcodone-acetaminophen (NORCO/VICODIN) 5-325 MG tablet Take 1 tablet by mouth every 6 (six) hours as needed (for lower back pain.). 20 tablet 0  . linaclotide (LINZESS) 145 MCG CAPS capsule Take 145 mcg by mouth daily before breakfast.    . polyethylene glycol (MIRALAX / GLYCOLAX) packet Take 17 g by mouth daily as needed (for constipation).    . simvastatin (ZOCOR) 40 MG tablet TAKE ONE TABLET BY MOUTH AT BEDTIME    . warfarin (COUMADIN) 5 MG tablet Take 5 mg by mouth at  bedtime.     . diphenhydramine-acetaminophen (TYLENOL PM) 25-500 MG TABS tablet Take 1 tablet by mouth at bedtime as needed.    Mariane Baumgarten Sodium 100 MG capsule Take by mouth.    . ferrous sulfate 325 (65 FE) MG tablet Take by mouth.    . linaclotide (LINZESS) 145 MCG CAPS capsule Take by mouth.    Orlie Dakin Sodium 8.6-50 MG CAPS Take by mouth.     No current facility-administered medications for this visit.     Past Medical History:  Diagnosis Date  . Abdominal aortic aneurysm (AAA) ([place])   . Atrial fibrillation ([place])   . Cancer ([place])   . Chronic back pain   . Colon polyp   . Constipation   . Coronary artery disease   . Elevated lipids   . Hemorrhage   . Hyperlipidemia   . Hypertension   . Myocardial infarction ([place])   . Sleep apnea     Past Surgical History:  Procedure Laterality Date  . CARDIAC SURGERY    . HEMORRHOID SURGERY    . IRRIGATION AND DEBRIDEMENT HEMATOMA    . PERIPHERAL VASCULAR CATHETERIZATION N/A 09/25/2016   Procedure: Endovascular Repair/Stent Graft;  Surgeon: Algernon Huxley, MD;  Location: [facility];  Service: Cardiovascular;  Laterality: N/A;    Social History Social History  Substance Use  Topics  . Smoking status: Former Smoker    Quit date: 11/11/2002  . Smokeless tobacco: Never Used  . Alcohol use No      Family History Family History  Problem Relation Age of Onset  . Cancer - Other Brother   . Prostate cancer Neg Hx   . Bladder Cancer Neg Hx   . Kidney cancer Neg Hx     No Known Allergies   REVIEW OF SYSTEMS (Negative unless checked)  Constitutional: [] Weight loss  [] Fever  [] Chills Cardiac: [] Chest pain   [] Chest pressure   [] Palpitations   [] Shortness of breath when laying flat   [] Shortness of breath at rest   [x] Shortness of breath with exertion. Vascular:  [] Pain in legs with walking   [] Pain in legs at rest   [] Pain in legs when laying flat   [] Claudication   [] Pain in feet when walking  [] Pain in feet at  rest  [] Pain in feet when laying flat   [] History of DVT   [] Phlebitis   [x] Swelling in legs   [] Varicose veins   [] Non-healing ulcers Pulmonary:   [] Uses home oxygen   [] Productive cough   [] Hemoptysis   [] Wheeze  [] COPD   [] Asthma Neurologic:  [] Dizziness  [] Blackouts   [] Seizures   [] History of stroke   [] History of TIA  [] Aphasia   [] Temporary blindness   [] Dysphagia   [] Weakness or numbness in arms   [] Weakness or numbness in legs Musculoskeletal:  [x] Arthritis   [] Joint swelling   [] Joint pain   [] Low back pain Hematologic:  [] Easy bruising  [] Easy bleeding   [] Hypercoagulable state   [] Anemic   Gastrointestinal:  [] Blood in stool   [] Vomiting blood  [] Gastroesophageal reflux/heartburn   [] Abdominal pain Genitourinary:  [] Chronic kidney disease   [] Difficult urination  [] Frequent urination  [] Burning with urination   [] Hematuria Skin:  [] Rashes   [] Ulcers   [] Wounds Psychological:  [] History of anxiety   []  History of major depression.  Physical Examination  BP 118/68   Pulse 68   Resp 16   Wt 275 lb 9.6 oz (125 kg)   BMI 39.54 kg/m  Gen:  WD/WN, NAD. Obese Head: [place]/AT, No temporalis wasting. Ear/Nose/Throat: Hearing grossly intact, nares w/o erythema or drainage, trachea midline Eyes: Conjunctiva clear. Sclera non-icteric Neck: Supple.  No JVD.  Pulmonary:  Good air movement, no use of accessory muscles.  Cardiac: Irregular Vascular:                                            Musculoskeletal: M/S 5/5 throughout.  No deformity or atrophy. Mild lower extremity edema. Neurologic: Sensation grossly intact in extremities.  Symmetrical.  Speech is fluent.  Psychiatric: Judgment intact, Mood & affect appropriate for pt's clinical situation. Dermatologic: No rashes or ulcers noted.  No cellulitis or open wounds.       Labs No results found for this or any previous visit (from the past 2160 hour(s)).  Radiology No results  found.    Assessment/Plan  Hyperlipidemia lipid control important in reducing the progression of atherosclerotic disease. Continue statin therapy   Hypertension blood pressure control important in reducing the progression of atherosclerotic disease. On appropriate oral medications.   AAA (abdominal aortic aneurysm) without rupture [facility]) He is now about 1 year status post endovascular repair of his aneurysm. His aneurysm duplex today reveals no endoleak with a  patent stent graft and a maximal sac diameter measuring about 4.7 cm which is similar to his previous visit about 6 months ago. He is doing well. Plan to recheck an aneurysm duplex in 1 year or sooner if problems develop in the interim. Continue current medical regimen.    Leotis Pain, MD  08/01/2017 9:30 AM    This note was created with Dragon medical transcription system.  Any errors from dictation are purely unintentional

## 2017-08-01 NOTE — Patient Instructions (Signed)
Aneurisma de aorta abdominal (Abdominal Aortic Aneurysm) La sangre es impulsada por el corazn a travs de conductos (vasos sanguneos) llamados arterias. Los aneurismas son puntos dbiles o daados en la pared de una arteria. Es una zona que se hincha como un globo. Un aneurisma artico abdominal se produce en la principal arteria del cuerpo (aorta). Puede desgarrarse o romperse, causando sangrado en el interior del cuerpo. Esto es Engineer, maintenance (IT). Es necesario Chartered certified accountant tratamiento inmediato. CAUSAS Se desconoce la causa exacta. Los factores que podran causar este problema son:  Daphene Jaeger y otras sustancias que se acumulan en el revestimiento de ese conducto.  Inflamacin de las paredes de un vaso sanguneo.  Ciertas enfermedades de los tejidos.  Traumatismo en el vientre (abdominal).  Una infeccin en la arteria principal del cuerpo. [place] riesgo son los que favorecen la formacin de un aneurisma. Ellos son:  Tener ms de 60 aos.  Presin arterial elevada (hipertensin).  Sexo masculino.  Raza blanca.  Tener sobrepeso (obesidad).  Tener antecedentes familiares de aneurisma.  Usar productos del tabaco. PREVENCIN Para disminuir los riesgos de sufrir esta afeccin:  Deje de fumar. No masque tabaco.  Limite o evite el alcohol  Mantenga la presin arterial, el nivel de azcar en la sangre y el colesterol dentro de lmites normales.  Use menos sal.  Consuma una dieta baja en grasas saturadas y colesterol. Son las grasas animales y las que se encuentran en los lcteos enteros.  Consuma ms fibra. La fibra se encuentra en frutas, verduras y granos enteros.  Mantenga un peso saludable.  Mantngase activo y ejerctese. SNTOMAS Los sntomas dependen del tamao del aneurisma y la rapidez con que se desarroll. Puede ser que no tenga sntomas. Si hay sntomas, pueden ser:  Dolor (en el vientre, en un costado, en la cintura o en la  ingle).  Sensacin de estar lleno despus de comer una pequea cantidad de comida.  Sentir Engineer, site (nuseas), vmitos, o ambos.  Sentir un nudo en el vientre, con la sensacin de sentir latidos (pulstil).  Siente que se va a desvanecer (desmayo). TRATAMIENTO  Medicamentos para controlar la presin arterial y Multimedia programmer.  Pruebas de diagnstico por imgenes para ver si el aneurisma se agranda.  Ciruga.  ASEGRESE DE QUE:  Comprende estas instrucciones.  Controlar su enfermedad.  Solicitar ayuda de inmediato si no mejora o si empeora.  Esta informacin no tiene Marine scientist el consejo del mdico. Asegrese de hacerle al mdico cualquier pregunta que tenga. Document Released: 02/22/2013 Document Revised: 02/22/2013 Document Reviewed: 11/27/2012 Elsevier Interactive Patient Education  2017 [employer].

## 2017-08-01 NOTE — Assessment & Plan Note (Signed)
blood pressure control important in reducing the progression of atherosclerotic disease. On appropriate oral medications.  

## 2017-08-01 NOTE — Assessment & Plan Note (Signed)
lipid control important in reducing the progression of atherosclerotic disease. Continue statin therapy  

## 2017-08-01 NOTE — Assessment & Plan Note (Signed)
He is now about 1 year status post endovascular repair of his aneurysm. His aneurysm duplex today reveals no endoleak with a patent stent graft and a maximal sac diameter measuring about 4.7 cm which is similar to his previous visit about 6 months ago. He is doing well. Plan to recheck an aneurysm duplex in 1 year or sooner if problems develop in the interim. Continue current medical regimen.

## 2017-08-04 DIAGNOSIS — Z7901 Long term (current) use of anticoagulants: Secondary | ICD-10-CM | POA: Diagnosis not present

## 2017-08-27 DIAGNOSIS — H2513 Age-related nuclear cataract, bilateral: Secondary | ICD-10-CM | POA: Diagnosis not present

## 2017-09-04 DIAGNOSIS — Z7901 Long term (current) use of anticoagulants: Secondary | ICD-10-CM | POA: Diagnosis not present

## 2017-10-06 DIAGNOSIS — Z7901 Long term (current) use of anticoagulants: Secondary | ICD-10-CM | POA: Diagnosis not present

## 2017-10-08 DIAGNOSIS — G4733 Obstructive sleep apnea (adult) (pediatric): Secondary | ICD-10-CM | POA: Diagnosis not present

## 2017-10-16 DIAGNOSIS — I2510 Atherosclerotic heart disease of native coronary artery without angina pectoris: Secondary | ICD-10-CM | POA: Diagnosis not present

## 2017-10-16 DIAGNOSIS — I482 Chronic atrial fibrillation: Secondary | ICD-10-CM | POA: Diagnosis not present

## 2017-10-16 DIAGNOSIS — G4730 Sleep apnea, unspecified: Secondary | ICD-10-CM | POA: Diagnosis not present

## 2017-10-16 DIAGNOSIS — R0602 Shortness of breath: Secondary | ICD-10-CM | POA: Diagnosis not present

## 2017-10-16 DIAGNOSIS — I739 Peripheral vascular disease, unspecified: Secondary | ICD-10-CM | POA: Diagnosis not present

## 2017-10-16 DIAGNOSIS — Z9889 Other specified postprocedural states: Secondary | ICD-10-CM | POA: Diagnosis not present

## 2017-10-16 DIAGNOSIS — I10 Essential (primary) hypertension: Secondary | ICD-10-CM | POA: Diagnosis not present

## 2017-10-16 DIAGNOSIS — E785 Hyperlipidemia, unspecified: Secondary | ICD-10-CM | POA: Diagnosis not present

## 2017-10-23 DIAGNOSIS — I482 Chronic atrial fibrillation: Secondary | ICD-10-CM | POA: Diagnosis not present

## 2017-10-23 DIAGNOSIS — E785 Hyperlipidemia, unspecified: Secondary | ICD-10-CM | POA: Diagnosis not present

## 2017-10-23 DIAGNOSIS — I10 Essential (primary) hypertension: Secondary | ICD-10-CM | POA: Diagnosis not present

## 2017-10-29 DIAGNOSIS — I10 Essential (primary) hypertension: Secondary | ICD-10-CM | POA: Diagnosis not present

## 2017-10-29 DIAGNOSIS — E785 Hyperlipidemia, unspecified: Secondary | ICD-10-CM | POA: Diagnosis not present

## 2017-10-29 DIAGNOSIS — D692 Other nonthrombocytopenic purpura: Secondary | ICD-10-CM | POA: Diagnosis not present

## 2017-10-29 DIAGNOSIS — G4730 Sleep apnea, unspecified: Secondary | ICD-10-CM | POA: Diagnosis not present

## 2017-10-29 DIAGNOSIS — I2510 Atherosclerotic heart disease of native coronary artery without angina pectoris: Secondary | ICD-10-CM | POA: Diagnosis not present

## 2017-10-29 DIAGNOSIS — I482 Chronic atrial fibrillation: Secondary | ICD-10-CM | POA: Diagnosis not present

## 2017-10-29 DIAGNOSIS — I739 Peripheral vascular disease, unspecified: Secondary | ICD-10-CM | POA: Diagnosis not present

## 2017-11-24 DIAGNOSIS — Z7901 Long term (current) use of anticoagulants: Secondary | ICD-10-CM | POA: Diagnosis not present

## 2017-12-09 DIAGNOSIS — L538 Other specified erythematous conditions: Secondary | ICD-10-CM | POA: Diagnosis not present

## 2017-12-09 DIAGNOSIS — R208 Other disturbances of skin sensation: Secondary | ICD-10-CM | POA: Diagnosis not present

## 2017-12-09 DIAGNOSIS — L918 Other hypertrophic disorders of the skin: Secondary | ICD-10-CM | POA: Diagnosis not present

## 2017-12-09 DIAGNOSIS — R58 Hemorrhage, not elsewhere classified: Secondary | ICD-10-CM | POA: Diagnosis not present

## 2017-12-22 DIAGNOSIS — I482 Chronic atrial fibrillation: Secondary | ICD-10-CM | POA: Diagnosis not present

## 2018-01-08 ENCOUNTER — Encounter: Payer: Self-pay | Admitting: Cardiology

## 2018-01-12 DIAGNOSIS — G4733 Obstructive sleep apnea (adult) (pediatric): Secondary | ICD-10-CM | POA: Diagnosis not present

## 2018-01-20 DIAGNOSIS — I482 Chronic atrial fibrillation: Secondary | ICD-10-CM | POA: Diagnosis not present

## 2018-01-26 DIAGNOSIS — X32XXXA Exposure to sunlight, initial encounter: Secondary | ICD-10-CM | POA: Diagnosis not present

## 2018-01-26 DIAGNOSIS — C44319 Basal cell carcinoma of skin of other parts of face: Secondary | ICD-10-CM | POA: Diagnosis not present

## 2018-01-26 DIAGNOSIS — D485 Neoplasm of uncertain behavior of skin: Secondary | ICD-10-CM | POA: Diagnosis not present

## 2018-01-26 DIAGNOSIS — D225 Melanocytic nevi of trunk: Secondary | ICD-10-CM | POA: Diagnosis not present

## 2018-01-26 DIAGNOSIS — D2261 Melanocytic nevi of right upper limb, including shoulder: Secondary | ICD-10-CM | POA: Diagnosis not present

## 2018-01-26 DIAGNOSIS — L570 Actinic keratosis: Secondary | ICD-10-CM | POA: Diagnosis not present

## 2018-01-26 DIAGNOSIS — D2272 Melanocytic nevi of left lower limb, including hip: Secondary | ICD-10-CM | POA: Diagnosis not present

## 2018-01-26 DIAGNOSIS — Z85828 Personal history of other malignant neoplasm of skin: Secondary | ICD-10-CM | POA: Diagnosis not present

## 2018-02-23 DIAGNOSIS — I482 Chronic atrial fibrillation: Secondary | ICD-10-CM | POA: Diagnosis not present

## 2018-03-05 DIAGNOSIS — C44319 Basal cell carcinoma of skin of other parts of face: Secondary | ICD-10-CM | POA: Diagnosis not present

## 2018-03-05 DIAGNOSIS — L905 Scar conditions and fibrosis of skin: Secondary | ICD-10-CM | POA: Diagnosis not present

## 2018-03-25 DIAGNOSIS — I482 Chronic atrial fibrillation: Secondary | ICD-10-CM | POA: Diagnosis not present

## 2018-04-16 DIAGNOSIS — I2510 Atherosclerotic heart disease of native coronary artery without angina pectoris: Secondary | ICD-10-CM | POA: Diagnosis not present

## 2018-04-16 DIAGNOSIS — G4730 Sleep apnea, unspecified: Secondary | ICD-10-CM | POA: Diagnosis not present

## 2018-04-16 DIAGNOSIS — E785 Hyperlipidemia, unspecified: Secondary | ICD-10-CM | POA: Diagnosis not present

## 2018-04-16 DIAGNOSIS — I482 Chronic atrial fibrillation: Secondary | ICD-10-CM | POA: Diagnosis not present

## 2018-04-16 DIAGNOSIS — I739 Peripheral vascular disease, unspecified: Secondary | ICD-10-CM | POA: Diagnosis not present

## 2018-04-16 DIAGNOSIS — Z9889 Other specified postprocedural states: Secondary | ICD-10-CM | POA: Diagnosis not present

## 2018-04-16 DIAGNOSIS — I10 Essential (primary) hypertension: Secondary | ICD-10-CM | POA: Diagnosis not present

## 2018-04-17 DIAGNOSIS — G4733 Obstructive sleep apnea (adult) (pediatric): Secondary | ICD-10-CM | POA: Diagnosis not present

## 2018-04-23 DIAGNOSIS — I2510 Atherosclerotic heart disease of native coronary artery without angina pectoris: Secondary | ICD-10-CM | POA: Diagnosis not present

## 2018-04-23 DIAGNOSIS — E785 Hyperlipidemia, unspecified: Secondary | ICD-10-CM | POA: Diagnosis not present

## 2018-04-23 DIAGNOSIS — I10 Essential (primary) hypertension: Secondary | ICD-10-CM | POA: Diagnosis not present

## 2018-04-23 DIAGNOSIS — I482 Chronic atrial fibrillation: Secondary | ICD-10-CM | POA: Diagnosis not present

## 2018-04-23 DIAGNOSIS — I739 Peripheral vascular disease, unspecified: Secondary | ICD-10-CM | POA: Diagnosis not present

## 2018-04-23 DIAGNOSIS — D692 Other nonthrombocytopenic purpura: Secondary | ICD-10-CM | POA: Diagnosis not present

## 2018-04-30 DIAGNOSIS — E785 Hyperlipidemia, unspecified: Secondary | ICD-10-CM | POA: Diagnosis not present

## 2018-04-30 DIAGNOSIS — I739 Peripheral vascular disease, unspecified: Secondary | ICD-10-CM | POA: Diagnosis not present

## 2018-04-30 DIAGNOSIS — G4730 Sleep apnea, unspecified: Secondary | ICD-10-CM | POA: Diagnosis not present

## 2018-04-30 DIAGNOSIS — Z0000 Encounter for general adult medical examination without abnormal findings: Secondary | ICD-10-CM | POA: Diagnosis not present

## 2018-04-30 DIAGNOSIS — I2510 Atherosclerotic heart disease of native coronary artery without angina pectoris: Secondary | ICD-10-CM | POA: Diagnosis not present

## 2018-04-30 DIAGNOSIS — I10 Essential (primary) hypertension: Secondary | ICD-10-CM | POA: Diagnosis not present

## 2018-04-30 DIAGNOSIS — D692 Other nonthrombocytopenic purpura: Secondary | ICD-10-CM | POA: Diagnosis not present

## 2018-04-30 DIAGNOSIS — I482 Chronic atrial fibrillation: Secondary | ICD-10-CM | POA: Diagnosis not present

## 2018-05-13 DIAGNOSIS — H903 Sensorineural hearing loss, bilateral: Secondary | ICD-10-CM | POA: Diagnosis not present

## 2018-05-13 DIAGNOSIS — H6122 Impacted cerumen, left ear: Secondary | ICD-10-CM | POA: Diagnosis not present

## 2018-05-13 DIAGNOSIS — H6063 Unspecified chronic otitis externa, bilateral: Secondary | ICD-10-CM | POA: Diagnosis not present

## 2018-05-18 DIAGNOSIS — I482 Chronic atrial fibrillation: Secondary | ICD-10-CM | POA: Diagnosis not present

## 2018-06-01 DIAGNOSIS — I482 Chronic atrial fibrillation: Secondary | ICD-10-CM | POA: Diagnosis not present

## 2018-07-06 DIAGNOSIS — L821 Other seborrheic keratosis: Secondary | ICD-10-CM | POA: Diagnosis not present

## 2018-07-06 DIAGNOSIS — Z85828 Personal history of other malignant neoplasm of skin: Secondary | ICD-10-CM | POA: Diagnosis not present

## 2018-07-06 DIAGNOSIS — L570 Actinic keratosis: Secondary | ICD-10-CM | POA: Diagnosis not present

## 2018-07-06 DIAGNOSIS — Z08 Encounter for follow-up examination after completed treatment for malignant neoplasm: Secondary | ICD-10-CM | POA: Diagnosis not present

## 2018-07-06 DIAGNOSIS — X32XXXA Exposure to sunlight, initial encounter: Secondary | ICD-10-CM | POA: Diagnosis not present

## 2018-07-06 DIAGNOSIS — I482 Chronic atrial fibrillation: Secondary | ICD-10-CM | POA: Diagnosis not present

## 2018-07-20 DIAGNOSIS — G4733 Obstructive sleep apnea (adult) (pediatric): Secondary | ICD-10-CM | POA: Diagnosis not present

## 2018-07-20 DIAGNOSIS — I482 Chronic atrial fibrillation: Secondary | ICD-10-CM | POA: Diagnosis not present

## 2018-08-04 ENCOUNTER — Other Ambulatory Visit (INDEPENDENT_AMBULATORY_CARE_PROVIDER_SITE_OTHER): Payer: PPO

## 2018-08-04 ENCOUNTER — Ambulatory Visit (INDEPENDENT_AMBULATORY_CARE_PROVIDER_SITE_OTHER): Payer: PPO | Admitting: Vascular Surgery

## 2018-08-14 ENCOUNTER — Ambulatory Visit (INDEPENDENT_AMBULATORY_CARE_PROVIDER_SITE_OTHER): Payer: PPO | Admitting: Vascular Surgery

## 2018-08-14 ENCOUNTER — Ambulatory Visit (INDEPENDENT_AMBULATORY_CARE_PROVIDER_SITE_OTHER): Payer: PPO

## 2018-08-14 ENCOUNTER — Encounter (INDEPENDENT_AMBULATORY_CARE_PROVIDER_SITE_OTHER): Payer: Self-pay | Admitting: Vascular Surgery

## 2018-08-14 VITALS — BP 124/78 | HR 91 | Resp 17 | Ht 70.0 in | Wt 268.8 lb

## 2018-08-14 DIAGNOSIS — I714 Abdominal aortic aneurysm, without rupture, unspecified: Secondary | ICD-10-CM

## 2018-08-14 DIAGNOSIS — E785 Hyperlipidemia, unspecified: Secondary | ICD-10-CM | POA: Diagnosis not present

## 2018-08-14 DIAGNOSIS — Z87891 Personal history of nicotine dependence: Secondary | ICD-10-CM | POA: Diagnosis not present

## 2018-08-14 DIAGNOSIS — I10 Essential (primary) hypertension: Secondary | ICD-10-CM

## 2018-08-14 NOTE — Assessment & Plan Note (Signed)
His duplex today shows a patent stent graft without an endoleak and shrinkage of his aortic sac now measuring 4.56 cm x 4.15 cm.  Previously, this is 4.7 x 4.7. Doing well.  Recheck in one year

## 2018-08-14 NOTE — Progress Notes (Signed)
MRN : [id number]  Richard Wilkins is a 83 y.o. ([birth year]) male who presents with chief complaint of  Chief Complaint  Patient presents with  . Follow-up    73yr evar ultrasound  .  History of Present Illness: Patient returns today in follow up of his abdominal aortic aneurysm.  He is about 2 years status post endovascular repair of his abdominal aortic aneurysm.  He is doing well without any aneurysm related symptoms. Specifically, the patient denies new back or abdominal pain, or signs of peripheral embolization.  His duplex today shows a patent stent graft without an endoleak and shrinkage of his aortic sac now measuring 4.56 cm x 4.15 cm.  Previously, this is 4.7 x 4.7  Current Outpatient Medications  Medication Sig Dispense Refill  . acetaminophen (TYLENOL) 500 MG tablet Take 500-1,000 mg by mouth every 6 (six) hours as needed (for pain.).     [name] Kitchen alfuzosin (UROXATRAL) 10 MG 24 hr tablet Take 1 tablet (10 mg total) by mouth daily with breakfast.    . aspirin EC 81 MG tablet Take 81 mg by mouth daily.    . busPIRone (BUSPAR) 15 MG tablet Take 7.5-15 mg by mouth 2 (two) times daily as needed (for anxiety).     [name] Kitchen diltiazem (CARDIZEM) 60 MG tablet Take 60 mg by mouth daily as needed (for heart palpitations/flutter).     [name] Kitchen diltiazem (CARTIA XT) 180 MG 24 hr capsule TAKE ONE CAPSULE BY MOUTH ONCE DAILY    . diphenhydramine-acetaminophen (TYLENOL PM) 25-500 MG TABS tablet Take 1 tablet by mouth at bedtime as needed.    Mariane Baumgarten Sodium 100 MG capsule Take by mouth.    . ferrous sulfate 325 (65 FE) MG tablet Take by mouth.    . fluticasone (FLONASE) 50 MCG/ACT nasal spray Place 1 spray into both nostrils daily as needed (for allergies.).     [name] Kitchen HYDROcodone-acetaminophen (NORCO/VICODIN) 5-325 MG tablet Take 1 tablet by mouth every 6 (six) hours as needed (for lower back pain.). 20 tablet 0  . linaclotide (LINZESS) 145 MCG CAPS capsule Take 145 mcg by mouth daily before breakfast.    .  polyethylene glycol (MIRALAX / GLYCOLAX) packet Take 17 g by mouth daily as needed (for constipation).    Orlie Dakin Sodium 8.6-50 MG CAPS Take by mouth.    . simvastatin (ZOCOR) 40 MG tablet TAKE ONE TABLET BY MOUTH AT BEDTIME    . warfarin (COUMADIN) 5 MG tablet Take 5 mg by mouth at bedtime.     [name] Kitchen linaclotide (LINZESS) 145 MCG CAPS capsule Take by mouth.     No current facility-administered medications for this visit.     Past Medical History:  Diagnosis Date  . Abdominal aortic aneurysm (AAA) ([place])   . Atrial fibrillation ([place])   . Cancer ([place])   . Chronic back pain   . Colon polyp   . Constipation   . Coronary artery disease   . Elevated lipids   . Hemorrhage   . Hyperlipidemia   . Hypertension   . Myocardial infarction ([place])   . Sleep apnea     Past Surgical History:  Procedure Laterality Date  . CARDIAC SURGERY    . HEMORRHOID SURGERY    . IRRIGATION AND DEBRIDEMENT HEMATOMA    . PERIPHERAL VASCULAR CATHETERIZATION N/A 09/25/2016   Procedure: Endovascular Repair/Stent Graft;  Surgeon: Algernon Huxley, MD;  Location: [facility];  Service: Cardiovascular;  Laterality: N/A;    Social History  Substance Use Topics  . Smoking status: Former Smoker    Quit date: 11/11/2002  . Smokeless tobacco: Never Used  . Alcohol use No      Family History      Family History  Problem Relation Age of Onset  . Cancer - Other Brother   . Prostate cancer Neg Hx   . Bladder Cancer Neg Hx   . Kidney cancer Neg Hx     No Known Allergies   REVIEW OF SYSTEMS (Negative unless checked)  Constitutional: [] Weight loss  [] Fever  [] Chills Cardiac: [] Chest pain   [] Chest pressure   [] Palpitations   [] Shortness of breath when laying flat   [] Shortness of breath at rest   [x] Shortness of breath with exertion. Vascular:  [] Pain in legs with walking   [] Pain in legs at rest   [] Pain in legs when laying flat   [] Claudication   [] Pain in feet when walking   [] Pain in feet at rest  [] Pain in feet when laying flat   [] History of DVT   [] Phlebitis   [x] Swelling in legs   [] Varicose veins   [] Non-healing ulcers Pulmonary:   [] Uses home oxygen   [] Productive cough   [] Hemoptysis   [] Wheeze  [] COPD   [] Asthma Neurologic:  [] Dizziness  [] Blackouts   [] Seizures   [] History of stroke   [] History of TIA  [] Aphasia   [] Temporary blindness   [] Dysphagia   [] Weakness or numbness in arms   [] Weakness or numbness in legs Musculoskeletal:  [x] Arthritis   [] Joint swelling   [] Joint pain   [] Low back pain Hematologic:  [] Easy bruising  [] Easy bleeding   [] Hypercoagulable state   [] Anemic   Gastrointestinal:  [] Blood in stool   [] Vomiting blood  [] Gastroesophageal reflux/heartburn   [] Abdominal pain Genitourinary:  [] Chronic kidney disease   [] Difficult urination  [] Frequent urination  [] Burning with urination   [] Hematuria Skin:  [] Rashes   [] Ulcers   [] Wounds Psychological:  [] History of anxiety   []  History of major depression.    Physical Examination  BP 124/78 (BP Location: Right Arm)   Pulse 91   Resp 17   Ht 5\' 10"  (1.778 m)   Wt 268 lb 12.8 oz (121.9 kg)   BMI 38.57 kg/m  Gen:  WD/WN, NAD Head: [place]/AT, No temporalis wasting. Ear/Nose/Throat: Hearing grossly intact, nares w/o erythema or drainage Eyes: Conjunctiva clear. Sclera non-icteric Neck: Supple.  Trachea midline Pulmonary:  Good air movement, no use of accessory muscles.  Cardiac: RRR, no JVD Vascular:  Vessel Right Left  Radial Palpable Palpable                                   Gastrointestinal: soft, non-tender/non-distended. No increased aortic impulse Musculoskeletal: M/S 5/5 throughout.  No deformity or atrophy.  Neurologic: Sensation grossly intact in extremities.  Symmetrical.  Speech is fluent.  Psychiatric: Judgment intact, Mood & affect appropriate for pt's clinical situation. Dermatologic: No rashes or ulcers noted.  No cellulitis or open wounds.       Labs No  results found for this or any previous visit (from the past 2160 hour(s)).  Radiology No results found.  Assessment/Plan Hyperlipidemia lipid control important in reducing the progression of atherosclerotic disease. Continue statin therapy   Hypertension blood pressure control important in reducing the progression of atherosclerotic disease. On appropriate oral medications.  AAA (abdominal aortic aneurysm) without rupture ([facility]) His duplex today shows a  patent stent graft without an endoleak and shrinkage of his aortic sac now measuring 4.56 cm x 4.15 cm.  Previously, this is 4.7 x 4.7. Doing well.  Recheck in one year    Leotis Pain, MD  08/14/2018 10:14 AM    This note was created with Dragon medical transcription system.  Any errors from dictation are purely unintentional

## 2018-08-19 DIAGNOSIS — I482 Chronic atrial fibrillation, unspecified: Secondary | ICD-10-CM | POA: Diagnosis not present

## 2018-09-03 DIAGNOSIS — I2510 Atherosclerotic heart disease of native coronary artery without angina pectoris: Secondary | ICD-10-CM | POA: Diagnosis not present

## 2018-09-03 DIAGNOSIS — I482 Chronic atrial fibrillation, unspecified: Secondary | ICD-10-CM | POA: Diagnosis not present

## 2018-09-14 DIAGNOSIS — B079 Viral wart, unspecified: Secondary | ICD-10-CM | POA: Diagnosis not present

## 2018-09-14 DIAGNOSIS — D485 Neoplasm of uncertain behavior of skin: Secondary | ICD-10-CM | POA: Diagnosis not present

## 2018-09-14 DIAGNOSIS — R208 Other disturbances of skin sensation: Secondary | ICD-10-CM | POA: Diagnosis not present

## 2018-09-14 DIAGNOSIS — L298 Other pruritus: Secondary | ICD-10-CM | POA: Diagnosis not present

## 2018-09-14 DIAGNOSIS — L538 Other specified erythematous conditions: Secondary | ICD-10-CM | POA: Diagnosis not present

## 2018-09-14 DIAGNOSIS — L820 Inflamed seborrheic keratosis: Secondary | ICD-10-CM | POA: Diagnosis not present

## 2018-10-05 DIAGNOSIS — I482 Chronic atrial fibrillation, unspecified: Secondary | ICD-10-CM | POA: Diagnosis not present

## 2018-10-14 DIAGNOSIS — I2510 Atherosclerotic heart disease of native coronary artery without angina pectoris: Secondary | ICD-10-CM | POA: Diagnosis not present

## 2018-10-14 DIAGNOSIS — I739 Peripheral vascular disease, unspecified: Secondary | ICD-10-CM | POA: Diagnosis not present

## 2018-10-14 DIAGNOSIS — I482 Chronic atrial fibrillation, unspecified: Secondary | ICD-10-CM | POA: Diagnosis not present

## 2018-10-14 DIAGNOSIS — E785 Hyperlipidemia, unspecified: Secondary | ICD-10-CM | POA: Diagnosis not present

## 2018-10-14 DIAGNOSIS — R0602 Shortness of breath: Secondary | ICD-10-CM | POA: Diagnosis not present

## 2018-10-14 DIAGNOSIS — I10 Essential (primary) hypertension: Secondary | ICD-10-CM | POA: Diagnosis not present

## 2018-10-14 DIAGNOSIS — Z9889 Other specified postprocedural states: Secondary | ICD-10-CM | POA: Diagnosis not present

## 2018-10-23 DIAGNOSIS — E785 Hyperlipidemia, unspecified: Secondary | ICD-10-CM | POA: Diagnosis not present

## 2018-10-23 DIAGNOSIS — I482 Chronic atrial fibrillation, unspecified: Secondary | ICD-10-CM | POA: Diagnosis not present

## 2018-10-23 DIAGNOSIS — I2510 Atherosclerotic heart disease of native coronary artery without angina pectoris: Secondary | ICD-10-CM | POA: Diagnosis not present

## 2018-10-23 DIAGNOSIS — I10 Essential (primary) hypertension: Secondary | ICD-10-CM | POA: Diagnosis not present

## 2018-10-28 DIAGNOSIS — G4733 Obstructive sleep apnea (adult) (pediatric): Secondary | ICD-10-CM | POA: Diagnosis not present

## 2018-10-30 DIAGNOSIS — I2510 Atherosclerotic heart disease of native coronary artery without angina pectoris: Secondary | ICD-10-CM | POA: Diagnosis not present

## 2018-10-30 DIAGNOSIS — I739 Peripheral vascular disease, unspecified: Secondary | ICD-10-CM | POA: Diagnosis not present

## 2018-10-30 DIAGNOSIS — G4730 Sleep apnea, unspecified: Secondary | ICD-10-CM | POA: Diagnosis not present

## 2018-10-30 DIAGNOSIS — Z79899 Other long term (current) drug therapy: Secondary | ICD-10-CM | POA: Diagnosis not present

## 2018-10-30 DIAGNOSIS — E785 Hyperlipidemia, unspecified: Secondary | ICD-10-CM | POA: Diagnosis not present

## 2018-10-30 DIAGNOSIS — D692 Other nonthrombocytopenic purpura: Secondary | ICD-10-CM | POA: Diagnosis not present

## 2018-10-30 DIAGNOSIS — I10 Essential (primary) hypertension: Secondary | ICD-10-CM | POA: Diagnosis not present

## 2018-10-30 DIAGNOSIS — I482 Chronic atrial fibrillation, unspecified: Secondary | ICD-10-CM | POA: Diagnosis not present

## 2018-11-23 DIAGNOSIS — I2510 Atherosclerotic heart disease of native coronary artery without angina pectoris: Secondary | ICD-10-CM | POA: Diagnosis not present

## 2018-11-23 DIAGNOSIS — I482 Chronic atrial fibrillation, unspecified: Secondary | ICD-10-CM | POA: Diagnosis not present

## 2018-12-24 DIAGNOSIS — I482 Chronic atrial fibrillation, unspecified: Secondary | ICD-10-CM | POA: Diagnosis not present

## 2019-01-21 DIAGNOSIS — I482 Chronic atrial fibrillation, unspecified: Secondary | ICD-10-CM | POA: Diagnosis not present

## 2019-01-27 DIAGNOSIS — G4733 Obstructive sleep apnea (adult) (pediatric): Secondary | ICD-10-CM | POA: Diagnosis not present

## 2019-02-24 DIAGNOSIS — I482 Chronic atrial fibrillation, unspecified: Secondary | ICD-10-CM | POA: Diagnosis not present

## 2019-03-29 DIAGNOSIS — I482 Chronic atrial fibrillation, unspecified: Secondary | ICD-10-CM | POA: Diagnosis not present

## 2019-04-13 DIAGNOSIS — W010XXA Fall on same level from slipping, tripping and stumbling without subsequent striking against object, initial encounter: Secondary | ICD-10-CM | POA: Diagnosis not present

## 2019-04-13 DIAGNOSIS — R0789 Other chest pain: Secondary | ICD-10-CM | POA: Diagnosis not present

## 2019-04-13 DIAGNOSIS — S8011XA Contusion of right lower leg, initial encounter: Secondary | ICD-10-CM | POA: Diagnosis not present

## 2019-04-13 DIAGNOSIS — R42 Dizziness and giddiness: Secondary | ICD-10-CM | POA: Diagnosis not present

## 2019-04-15 DIAGNOSIS — H8110 Benign paroxysmal vertigo, unspecified ear: Secondary | ICD-10-CM | POA: Diagnosis not present

## 2019-04-15 DIAGNOSIS — H8112 Benign paroxysmal vertigo, left ear: Secondary | ICD-10-CM | POA: Diagnosis not present

## 2019-04-16 DIAGNOSIS — I482 Chronic atrial fibrillation, unspecified: Secondary | ICD-10-CM | POA: Diagnosis not present

## 2019-04-16 DIAGNOSIS — Z9889 Other specified postprocedural states: Secondary | ICD-10-CM | POA: Diagnosis not present

## 2019-04-16 DIAGNOSIS — I739 Peripheral vascular disease, unspecified: Secondary | ICD-10-CM | POA: Diagnosis not present

## 2019-04-16 DIAGNOSIS — R0602 Shortness of breath: Secondary | ICD-10-CM | POA: Diagnosis not present

## 2019-04-16 DIAGNOSIS — I10 Essential (primary) hypertension: Secondary | ICD-10-CM | POA: Diagnosis not present

## 2019-04-16 DIAGNOSIS — I2510 Atherosclerotic heart disease of native coronary artery without angina pectoris: Secondary | ICD-10-CM | POA: Diagnosis not present

## 2019-04-26 DIAGNOSIS — E785 Hyperlipidemia, unspecified: Secondary | ICD-10-CM | POA: Diagnosis not present

## 2019-04-26 DIAGNOSIS — I482 Chronic atrial fibrillation, unspecified: Secondary | ICD-10-CM | POA: Diagnosis not present

## 2019-04-26 DIAGNOSIS — Z79899 Other long term (current) drug therapy: Secondary | ICD-10-CM | POA: Diagnosis not present

## 2019-04-26 DIAGNOSIS — I10 Essential (primary) hypertension: Secondary | ICD-10-CM | POA: Diagnosis not present

## 2019-05-03 DIAGNOSIS — I10 Essential (primary) hypertension: Secondary | ICD-10-CM | POA: Diagnosis not present

## 2019-05-03 DIAGNOSIS — I739 Peripheral vascular disease, unspecified: Secondary | ICD-10-CM | POA: Diagnosis not present

## 2019-05-03 DIAGNOSIS — D692 Other nonthrombocytopenic purpura: Secondary | ICD-10-CM | POA: Diagnosis not present

## 2019-05-03 DIAGNOSIS — Z0000 Encounter for general adult medical examination without abnormal findings: Secondary | ICD-10-CM | POA: Diagnosis not present

## 2019-05-03 DIAGNOSIS — E785 Hyperlipidemia, unspecified: Secondary | ICD-10-CM | POA: Diagnosis not present

## 2019-05-03 DIAGNOSIS — I482 Chronic atrial fibrillation, unspecified: Secondary | ICD-10-CM | POA: Diagnosis not present

## 2019-05-03 DIAGNOSIS — G4730 Sleep apnea, unspecified: Secondary | ICD-10-CM | POA: Diagnosis not present

## 2019-05-03 DIAGNOSIS — I2510 Atherosclerotic heart disease of native coronary artery without angina pectoris: Secondary | ICD-10-CM | POA: Diagnosis not present

## 2019-05-10 DIAGNOSIS — C44319 Basal cell carcinoma of skin of other parts of face: Secondary | ICD-10-CM | POA: Diagnosis not present

## 2019-05-10 DIAGNOSIS — L570 Actinic keratosis: Secondary | ICD-10-CM | POA: Diagnosis not present

## 2019-05-10 DIAGNOSIS — X32XXXA Exposure to sunlight, initial encounter: Secondary | ICD-10-CM | POA: Diagnosis not present

## 2019-05-10 DIAGNOSIS — Z08 Encounter for follow-up examination after completed treatment for malignant neoplasm: Secondary | ICD-10-CM | POA: Diagnosis not present

## 2019-05-10 DIAGNOSIS — D485 Neoplasm of uncertain behavior of skin: Secondary | ICD-10-CM | POA: Diagnosis not present

## 2019-05-10 DIAGNOSIS — G4733 Obstructive sleep apnea (adult) (pediatric): Secondary | ICD-10-CM | POA: Diagnosis not present

## 2019-05-10 DIAGNOSIS — Z85828 Personal history of other malignant neoplasm of skin: Secondary | ICD-10-CM | POA: Diagnosis not present

## 2019-05-26 DIAGNOSIS — I482 Chronic atrial fibrillation, unspecified: Secondary | ICD-10-CM | POA: Diagnosis not present

## 2019-06-21 DIAGNOSIS — I482 Chronic atrial fibrillation, unspecified: Secondary | ICD-10-CM | POA: Diagnosis not present

## 2019-06-23 DIAGNOSIS — C44319 Basal cell carcinoma of skin of other parts of face: Secondary | ICD-10-CM | POA: Diagnosis not present

## 2019-06-25 DIAGNOSIS — G4733 Obstructive sleep apnea (adult) (pediatric): Secondary | ICD-10-CM | POA: Diagnosis not present

## 2019-07-22 DIAGNOSIS — I482 Chronic atrial fibrillation, unspecified: Secondary | ICD-10-CM | POA: Diagnosis not present

## 2019-07-26 DIAGNOSIS — G4733 Obstructive sleep apnea (adult) (pediatric): Secondary | ICD-10-CM | POA: Diagnosis not present

## 2019-08-13 ENCOUNTER — Other Ambulatory Visit (INDEPENDENT_AMBULATORY_CARE_PROVIDER_SITE_OTHER): Payer: Self-pay | Admitting: Vascular Surgery

## 2019-08-13 DIAGNOSIS — I714 Abdominal aortic aneurysm, without rupture, unspecified: Secondary | ICD-10-CM

## 2019-08-17 ENCOUNTER — Ambulatory Visit (INDEPENDENT_AMBULATORY_CARE_PROVIDER_SITE_OTHER): Payer: PPO | Admitting: Vascular Surgery

## 2019-08-17 ENCOUNTER — Encounter (INDEPENDENT_AMBULATORY_CARE_PROVIDER_SITE_OTHER): Payer: Self-pay | Admitting: Vascular Surgery

## 2019-08-17 ENCOUNTER — Other Ambulatory Visit: Payer: Self-pay

## 2019-08-17 ENCOUNTER — Ambulatory Visit (INDEPENDENT_AMBULATORY_CARE_PROVIDER_SITE_OTHER): Payer: PPO

## 2019-08-17 VITALS — BP 135/75 | HR 76 | Resp 12 | Ht 70.0 in | Wt 274.0 lb

## 2019-08-17 DIAGNOSIS — I714 Abdominal aortic aneurysm, without rupture, unspecified: Secondary | ICD-10-CM

## 2019-08-17 DIAGNOSIS — I2510 Atherosclerotic heart disease of native coronary artery without angina pectoris: Secondary | ICD-10-CM | POA: Insufficient documentation

## 2019-08-17 DIAGNOSIS — E785 Hyperlipidemia, unspecified: Secondary | ICD-10-CM

## 2019-08-17 DIAGNOSIS — I739 Peripheral vascular disease, unspecified: Secondary | ICD-10-CM | POA: Insufficient documentation

## 2019-08-17 DIAGNOSIS — I10 Essential (primary) hypertension: Secondary | ICD-10-CM | POA: Diagnosis not present

## 2019-08-17 DIAGNOSIS — G4733 Obstructive sleep apnea (adult) (pediatric): Secondary | ICD-10-CM | POA: Diagnosis not present

## 2019-08-17 NOTE — Progress Notes (Signed)
MRN : [id number]  Richard Wilkins is a 84 y.o. (1935/08/14) male who presents with chief complaint of  Chief Complaint  Patient presents with  . Follow-up  .  History of Present Illness: Patient returns today in follow up of his abdominal aortic aneurysm.  He is about 3 years status post endovascular repair of his aneurysm.  He is doing well today without complaints.  No aneurysm related symptoms. Specifically, the patient denies new back or abdominal pain, or signs of peripheral embolization.  Aortic duplex today shows essentially no change in his abdominal aortic sac diameter measuring approximately 4.7 cm in maximal diameter with no endoleak and a patent stent graft.  Current Outpatient Medications  Medication Sig Dispense Refill  . acetaminophen (TYLENOL) 500 MG tablet Take 500-1,000 mg by mouth every 6 (six) hours as needed (for pain.).     [name] Kitchen alfuzosin (UROXATRAL) 10 MG 24 hr tablet Take 1 tablet (10 mg total) by mouth daily with breakfast.    . aspirin EC 81 MG tablet Take 81 mg by mouth daily.    . busPIRone (BUSPAR) 15 MG tablet Take 7.5-15 mg by mouth 2 (two) times daily as needed (for anxiety).     [name] Kitchen diltiazem (CARDIZEM) 60 MG tablet Take 60 mg by mouth daily as needed (for heart palpitations/flutter).     [name] Kitchen diltiazem (CARTIA XT) 180 MG 24 hr capsule TAKE ONE CAPSULE BY MOUTH ONCE DAILY    . diphenhydramine-acetaminophen (TYLENOL PM) 25-500 MG TABS tablet Take 1 tablet by mouth at bedtime as needed.    . fluticasone (FLONASE) 50 MCG/ACT nasal spray Place 1 spray into both nostrils daily as needed (for allergies.).     [name] Kitchen HYDROcodone-acetaminophen (NORCO/VICODIN) 5-325 MG tablet Take 1 tablet by mouth every 6 (six) hours as needed (for lower back pain.). 20 tablet 0  . linaclotide (LINZESS) 145 MCG CAPS capsule Take 145 mcg by mouth daily before breakfast.    . meclizine (ANTIVERT) 12.5 MG tablet TAKE 1 TO 2 TABLETS BY MOUTH THREE TIMES DAILY AS NEEDED FOR MOTION SICKNESS    .  polyethylene glycol (MIRALAX / GLYCOLAX) packet Take 17 g by mouth daily as needed (for constipation).    . simvastatin (ZOCOR) 40 MG tablet TAKE ONE TABLET BY MOUTH AT BEDTIME    . warfarin (COUMADIN) 5 MG tablet Take 5 mg by mouth at bedtime.      No current facility-administered medications for this visit.     Past Medical History:  Diagnosis Date  . Abdominal aortic aneurysm (AAA) ([place])   . Atrial fibrillation ([place])   . Cancer ([place])   . Chronic back pain   . Colon polyp   . Constipation   . Coronary artery disease   . Elevated lipids   . Hemorrhage   . Hyperlipidemia   . Hypertension   . Myocardial infarction ([place])   . Sleep apnea     Past Surgical History:  Procedure Laterality Date  . CARDIAC SURGERY    . HEMORRHOID SURGERY    . IRRIGATION AND DEBRIDEMENT HEMATOMA    . PERIPHERAL VASCULAR CATHETERIZATION N/A 09/25/2016   Procedure: Endovascular Repair/Stent Graft;  Surgeon: Algernon Huxley, MD;  Location: [facility];  Service: Cardiovascular;  Laterality: N/A;    Social History Social History   Tobacco Use  . Smoking status: Former Smoker    Quit date: 11/11/2002    Years since quitting: 16.7  . Smokeless tobacco: Never Used  Substance Use Topics  .  Alcohol use: No  . Drug use: No     Family History Family History  Problem Relation Age of Onset  . Cancer - Other Brother   . Prostate cancer Neg Hx   . Bladder Cancer Neg Hx   . Kidney cancer Neg Hx      No Known Allergies   REVIEW OF SYSTEMS(Negative unless checked)  Constitutional: [] ?Weight loss[] ?Fever[] ?Chills Cardiac:[] ?Chest pain[] ?Chest pressure[] ?Palpitations [] ?Shortness of breath when laying flat [] ?Shortness of breath at rest [x] ?Shortness of breath with exertion. Vascular: [] ?Pain in legs with walking[] ?Pain in legsat rest[] ?Pain in legs when laying flat [] ?Claudication [] ?Pain in feet when walking [] ?Pain in feet at rest [] ?Pain in feet when laying  flat [] ?History of DVT [] ?Phlebitis [x] ?Swelling in legs [] ?Varicose veins [] ?Non-healing ulcers Pulmonary: [] ?Uses home oxygen [] ?Productive cough[] ?Hemoptysis [] ?Wheeze [] ?COPD [] ?Asthma Neurologic: [] ?Dizziness [] ?Blackouts [] ?Seizures [] ?History of stroke [] ?History of TIA[] ?Aphasia [] ?Temporary blindness[] ?Dysphagia [] ?Weaknessor numbness in arms [] ?Weakness or numbnessin legs Musculoskeletal: [x] ?Arthritis [] ?Joint swelling [] ?Joint pain [] ?Low back pain Hematologic:[] ?Easy bruising[] ?Easy bleeding [] ?Hypercoagulable state [] ?Anemic  Gastrointestinal:[] ?Blood in stool[] ?Vomiting blood[] ?Gastroesophageal reflux/heartburn[] ?Abdominal pain Genitourinary: [] ?Chronic kidney disease [] ?Difficulturination [] ?Frequenturination [] ?Burning with urination[] ?Hematuria Skin: [] ?Rashes [] ?Ulcers [] ?Wounds Psychological: [] ?History of anxiety[] ?History of major depression.    Physical Examination  BP 135/75 (BP Location: Left Arm, Patient Position: Sitting, Cuff Size: Large)   Pulse 76   Resp 12   Ht 5\' 10"  (1.778 m)   Wt 274 lb (124.3 kg)   BMI 39.31 kg/m  Gen:  WD/WN, NAD. Obese  Head: [place]/AT, No temporalis wasting. Ear/Nose/Throat: Hearing grossly intact, nares w/o erythema or drainage Eyes: Conjunctiva clear. Sclera non-icteric Neck: Supple.  Trachea midline Pulmonary:  Good air movement, no use of accessory muscles.  Cardiac: RRR, no JVD Vascular:  Vessel Right Left  Radial Palpable Palpable                                   Gastrointestinal: soft, non-tender/non-distended. No increased aortic impulse Musculoskeletal: M/S 5/5 throughout.  No deformity or atrophy. Trace LE edema. Neurologic: Sensation grossly intact in extremities.  Symmetrical.  Speech is fluent.  Psychiatric: Judgment intact, Mood & affect appropriate for pt's clinical situation. Dermatologic: No rashes or ulcers noted.   No cellulitis or open wounds.       Labs No results found for this or any previous visit (from the past 2160 hour(s)).  Radiology No results found.  Assessment/Plan Hyperlipidemia lipid control important in reducing the progression of atherosclerotic disease. Continue statin therapy   Hypertension blood pressure control important in reducing the progression of atherosclerotic disease. On appropriate oral medications.  AAA (abdominal aortic aneurysm) without rupture ([facility]) Aortic duplex today shows essentially no change in his abdominal aortic sac diameter measuring approximately 4.7 cm in maximal diameter with no endoleak and a patent stent graft. Doing well.  Recheck in one year     Leotis Pain, MD  08/17/2019 9:43 AM    This note was created with Dragon medical transcription system.  Any errors from dictation are purely unintentional

## 2019-08-23 DIAGNOSIS — I482 Chronic atrial fibrillation, unspecified: Secondary | ICD-10-CM | POA: Diagnosis not present

## 2019-08-25 DIAGNOSIS — G4733 Obstructive sleep apnea (adult) (pediatric): Secondary | ICD-10-CM | POA: Diagnosis not present

## 2019-08-31 ENCOUNTER — Ambulatory Visit: Payer: PPO

## 2019-09-02 ENCOUNTER — Ambulatory Visit: Payer: PPO

## 2019-09-23 DIAGNOSIS — I482 Chronic atrial fibrillation, unspecified: Secondary | ICD-10-CM | POA: Diagnosis not present

## 2019-10-26 DIAGNOSIS — E785 Hyperlipidemia, unspecified: Secondary | ICD-10-CM | POA: Diagnosis not present

## 2019-10-26 DIAGNOSIS — Z9889 Other specified postprocedural states: Secondary | ICD-10-CM | POA: Diagnosis not present

## 2019-10-26 DIAGNOSIS — I482 Chronic atrial fibrillation, unspecified: Secondary | ICD-10-CM | POA: Diagnosis not present

## 2019-10-26 DIAGNOSIS — I10 Essential (primary) hypertension: Secondary | ICD-10-CM | POA: Diagnosis not present

## 2019-10-26 DIAGNOSIS — R0602 Shortness of breath: Secondary | ICD-10-CM | POA: Diagnosis not present

## 2019-10-26 DIAGNOSIS — I2510 Atherosclerotic heart disease of native coronary artery without angina pectoris: Secondary | ICD-10-CM | POA: Diagnosis not present

## 2019-10-27 DIAGNOSIS — Z85828 Personal history of other malignant neoplasm of skin: Secondary | ICD-10-CM | POA: Diagnosis not present

## 2019-10-27 DIAGNOSIS — X32XXXA Exposure to sunlight, initial encounter: Secondary | ICD-10-CM | POA: Diagnosis not present

## 2019-10-27 DIAGNOSIS — Z08 Encounter for follow-up examination after completed treatment for malignant neoplasm: Secondary | ICD-10-CM | POA: Diagnosis not present

## 2019-10-27 DIAGNOSIS — L570 Actinic keratosis: Secondary | ICD-10-CM | POA: Diagnosis not present

## 2019-10-27 DIAGNOSIS — L308 Other specified dermatitis: Secondary | ICD-10-CM | POA: Diagnosis not present

## 2019-11-02 DIAGNOSIS — E785 Hyperlipidemia, unspecified: Secondary | ICD-10-CM | POA: Diagnosis not present

## 2019-11-02 DIAGNOSIS — G4730 Sleep apnea, unspecified: Secondary | ICD-10-CM | POA: Diagnosis not present

## 2019-11-02 DIAGNOSIS — I10 Essential (primary) hypertension: Secondary | ICD-10-CM | POA: Diagnosis not present

## 2019-11-02 DIAGNOSIS — I2510 Atherosclerotic heart disease of native coronary artery without angina pectoris: Secondary | ICD-10-CM | POA: Diagnosis not present

## 2019-11-02 DIAGNOSIS — I739 Peripheral vascular disease, unspecified: Secondary | ICD-10-CM | POA: Diagnosis not present

## 2019-11-02 DIAGNOSIS — D692 Other nonthrombocytopenic purpura: Secondary | ICD-10-CM | POA: Diagnosis not present

## 2019-11-02 DIAGNOSIS — I482 Chronic atrial fibrillation, unspecified: Secondary | ICD-10-CM | POA: Diagnosis not present

## 2019-11-17 DIAGNOSIS — H2513 Age-related nuclear cataract, bilateral: Secondary | ICD-10-CM | POA: Diagnosis not present

## 2019-11-26 DIAGNOSIS — H538 Other visual disturbances: Secondary | ICD-10-CM | POA: Diagnosis not present

## 2019-11-26 DIAGNOSIS — H2513 Age-related nuclear cataract, bilateral: Secondary | ICD-10-CM | POA: Diagnosis not present

## 2019-11-26 DIAGNOSIS — D692 Other nonthrombocytopenic purpura: Secondary | ICD-10-CM | POA: Diagnosis not present

## 2019-11-26 DIAGNOSIS — I739 Peripheral vascular disease, unspecified: Secondary | ICD-10-CM | POA: Diagnosis not present

## 2019-11-26 DIAGNOSIS — I482 Chronic atrial fibrillation, unspecified: Secondary | ICD-10-CM | POA: Diagnosis not present

## 2019-11-29 DIAGNOSIS — I482 Chronic atrial fibrillation, unspecified: Secondary | ICD-10-CM | POA: Diagnosis not present

## 2019-12-13 DIAGNOSIS — I2510 Atherosclerotic heart disease of native coronary artery without angina pectoris: Secondary | ICD-10-CM | POA: Diagnosis not present

## 2019-12-13 DIAGNOSIS — H2511 Age-related nuclear cataract, right eye: Secondary | ICD-10-CM | POA: Diagnosis not present

## 2019-12-21 ENCOUNTER — Encounter: Payer: Self-pay | Admitting: Ophthalmology

## 2019-12-21 DIAGNOSIS — G4733 Obstructive sleep apnea (adult) (pediatric): Secondary | ICD-10-CM | POA: Diagnosis not present

## 2019-12-23 ENCOUNTER — Other Ambulatory Visit: Payer: Self-pay

## 2019-12-23 ENCOUNTER — Other Ambulatory Visit
Admission: RE | Admit: 2019-12-23 | Discharge: 2019-12-23 | Disposition: A | Discharge status: Home / Self Care | Payer: PPO | Source: Ambulatory Visit | Attending: Ophthalmology | Admitting: Ophthalmology

## 2019-12-23 DIAGNOSIS — Z01812 Encounter for preprocedural laboratory examination: Secondary | ICD-10-CM | POA: Diagnosis not present

## 2019-12-23 DIAGNOSIS — Z20822 Contact with and (suspected) exposure to covid-19: Secondary | ICD-10-CM | POA: Insufficient documentation

## 2019-12-23 LAB — SARS CORONAVIRUS 2 (TAT 6-24 HRS): SARS Coronavirus 2: NEGATIVE

## 2019-12-23 NOTE — Discharge Instructions (Signed)

## 2019-12-27 ENCOUNTER — Encounter
Admission: RE | Disposition: A | Discharge status: Home / Self Care | Payer: Self-pay | Source: Home / Self Care | Attending: Ophthalmology

## 2019-12-27 ENCOUNTER — Ambulatory Visit: Payer: PPO | Admitting: Anesthesiology

## 2019-12-27 ENCOUNTER — Encounter: Payer: Self-pay | Admitting: Ophthalmology

## 2019-12-27 ENCOUNTER — Ambulatory Visit
Admission: RE | Admit: 2019-12-27 | Discharge: 2019-12-27 | Disposition: A | Discharge status: Home / Self Care | Payer: PPO | Attending: Ophthalmology | Admitting: Ophthalmology

## 2019-12-27 ENCOUNTER — Other Ambulatory Visit: Payer: Self-pay

## 2019-12-27 DIAGNOSIS — I2510 Atherosclerotic heart disease of native coronary artery without angina pectoris: Secondary | ICD-10-CM | POA: Insufficient documentation

## 2019-12-27 DIAGNOSIS — Z79899 Other long term (current) drug therapy: Secondary | ICD-10-CM | POA: Diagnosis not present

## 2019-12-27 DIAGNOSIS — N400 Enlarged prostate without lower urinary tract symptoms: Secondary | ICD-10-CM | POA: Insufficient documentation

## 2019-12-27 DIAGNOSIS — Z955 Presence of coronary angioplasty implant and graft: Secondary | ICD-10-CM | POA: Diagnosis not present

## 2019-12-27 DIAGNOSIS — E7800 Pure hypercholesterolemia, unspecified: Secondary | ICD-10-CM | POA: Insufficient documentation

## 2019-12-27 DIAGNOSIS — I10 Essential (primary) hypertension: Secondary | ICD-10-CM | POA: Diagnosis not present

## 2019-12-27 DIAGNOSIS — I719 Aortic aneurysm of unspecified site, without rupture: Secondary | ICD-10-CM | POA: Diagnosis not present

## 2019-12-27 DIAGNOSIS — Z87891 Personal history of nicotine dependence: Secondary | ICD-10-CM | POA: Insufficient documentation

## 2019-12-27 DIAGNOSIS — Z86718 Personal history of other venous thrombosis and embolism: Secondary | ICD-10-CM | POA: Insufficient documentation

## 2019-12-27 DIAGNOSIS — I4891 Unspecified atrial fibrillation: Secondary | ICD-10-CM | POA: Diagnosis not present

## 2019-12-27 DIAGNOSIS — Z7901 Long term (current) use of anticoagulants: Secondary | ICD-10-CM | POA: Diagnosis not present

## 2019-12-27 DIAGNOSIS — H25811 Combined forms of age-related cataract, right eye: Secondary | ICD-10-CM | POA: Diagnosis not present

## 2019-12-27 DIAGNOSIS — Z7982 Long term (current) use of aspirin: Secondary | ICD-10-CM | POA: Insufficient documentation

## 2019-12-27 DIAGNOSIS — G4730 Sleep apnea, unspecified: Secondary | ICD-10-CM | POA: Insufficient documentation

## 2019-12-27 DIAGNOSIS — Z85828 Personal history of other malignant neoplasm of skin: Secondary | ICD-10-CM | POA: Diagnosis not present

## 2019-12-27 DIAGNOSIS — I739 Peripheral vascular disease, unspecified: Secondary | ICD-10-CM | POA: Diagnosis not present

## 2019-12-27 DIAGNOSIS — I252 Old myocardial infarction: Secondary | ICD-10-CM | POA: Insufficient documentation

## 2019-12-27 DIAGNOSIS — H2511 Age-related nuclear cataract, right eye: Secondary | ICD-10-CM | POA: Insufficient documentation

## 2019-12-27 HISTORY — PX: CATARACT EXTRACTION W/PHACO: SHX586

## 2019-12-27 HISTORY — DX: Unspecified osteoarthritis, unspecified site: M19.90

## 2019-12-27 HISTORY — DX: Localized swelling, mass and lump, lower limb, bilateral: R22.43

## 2019-12-27 SURGERY — PHACOEMULSIFICATION, CATARACT, WITH IOL INSERTION
Anesthesia: Monitor Anesthesia Care | Site: Eye | Laterality: Right

## 2019-12-27 MED ORDER — EPINEPHRINE PF 1 MG/ML IJ SOLN
INTRAOCULAR | Status: DC | PRN
  Administered 2019-12-27: 96 mL via OPHTHALMIC

## 2019-12-27 MED ORDER — ARMC OPHTHALMIC DILATING DROPS
1.0000 "application " | OPHTHALMIC | Status: DC | PRN
  Administered 2019-12-27 (×3): 1 via OPHTHALMIC

## 2019-12-27 MED ORDER — TETRACAINE HCL 0.5 % OP SOLN
1.0000 [drp] | OPHTHALMIC | Status: DC | PRN
  Administered 2019-12-27 (×3): 1 [drp] via OPHTHALMIC

## 2019-12-27 MED ORDER — LIDOCAINE HCL (PF) 2 % IJ SOLN
INTRAOCULAR | Status: DC | PRN
  Administered 2019-12-27: 1 mL via INTRAOCULAR

## 2019-12-27 MED ORDER — SODIUM HYALURONATE 23 MG/ML IO SOLN
INTRAOCULAR | Status: DC | PRN
  Administered 2019-12-27: 0.6 mL via INTRAOCULAR

## 2019-12-27 MED ORDER — ACETAMINOPHEN 325 MG PO TABS: 325.0000 mg | ORAL_TABLET | ORAL | Status: DC | PRN

## 2019-12-27 MED ORDER — FENTANYL CITRATE (PF) 100 MCG/2ML IJ SOLN
INTRAMUSCULAR | Status: DC | PRN
  Administered 2019-12-27 (×2): 50 ug via INTRAVENOUS

## 2019-12-27 MED ORDER — MIDAZOLAM HCL 2 MG/2ML IJ SOLN
INTRAMUSCULAR | Status: DC | PRN
  Administered 2019-12-27: 2 mg via INTRAVENOUS

## 2019-12-27 MED ORDER — ONDANSETRON HCL 4 MG/2ML IJ SOLN
INTRAMUSCULAR | Status: DC | PRN
  Administered 2019-12-27: 4 mg via INTRAVENOUS

## 2019-12-27 MED ORDER — ACETAMINOPHEN 160 MG/5ML PO SOLN: 325.0000 mg | ORAL | Status: DC | PRN

## 2019-12-27 MED ORDER — SODIUM HYALURONATE 10 MG/ML IO SOLN
INTRAOCULAR | Status: DC | PRN
  Administered 2019-12-27: 0.55 mL via INTRAOCULAR

## 2019-12-27 MED ORDER — MOXIFLOXACIN HCL 0.5 % OP SOLN
OPHTHALMIC | Status: DC | PRN
  Administered 2019-12-27: 0.2 mL via OPHTHALMIC

## 2019-12-27 SURGICAL SUPPLY — 19 items
CANNULA ANT/CHMB 27G (MISCELLANEOUS) ×2 IMPLANT
CANNULA ANT/CHMB 27GA (MISCELLANEOUS) ×6 IMPLANT
DISSECTOR HYDRO NUCLEUS 50X22 (MISCELLANEOUS) ×3 IMPLANT
GLOVE SURG LX 7.5 STRW (GLOVE) ×4
GLOVE SURG LX STRL 7.5 STRW (GLOVE) ×1 IMPLANT
GLOVE SURG SYN 8.5  E (GLOVE) ×2
GLOVE SURG SYN 8.5 E (GLOVE) ×1 IMPLANT
GLOVE SURG SYN 8.5 PF PI (GLOVE) ×1 IMPLANT
GOWN STRL REUS W/ TWL LRG LVL3 (GOWN DISPOSABLE) ×2 IMPLANT
GOWN STRL REUS W/TWL LRG LVL3 (GOWN DISPOSABLE) ×4
LENS IOL TECNIS ITEC 21.0 (Intraocular Lens) ×2 IMPLANT
MARKER SKIN DUAL TIP RULER LAB (MISCELLANEOUS) ×3 IMPLANT
PACK DR. KING ARMS (PACKS) ×3 IMPLANT
PACK EYE AFTER SURG (MISCELLANEOUS) ×3 IMPLANT
PACK OPTHALMIC (MISCELLANEOUS) ×3 IMPLANT
SYR 3ML LL SCALE MARK (SYRINGE) ×3 IMPLANT
SYR TB 1ML LUER SLIP (SYRINGE) ×3 IMPLANT
WATER STERILE IRR 250ML POUR (IV SOLUTION) ×3 IMPLANT
WIPE NON LINTING 3.25X3.25 (MISCELLANEOUS) ×3 IMPLANT

## 2019-12-27 NOTE — Anesthesia Postprocedure Evaluation (Signed)
Anesthesia Post Note  Patient: Richard Wilkins  Procedure(s) Performed: CATARACT EXTRACTION PHACO AND INTRAOCULAR LENS PLACEMENT (IOC) RIGHT (Right Eye)     Patient location during evaluation: PACU Anesthesia Type: MAC Level of consciousness: awake and alert Pain management: pain level controlled Vital Signs Assessment: post-procedure vital signs reviewed and stable Respiratory status: spontaneous breathing, nonlabored ventilation, respiratory function stable and patient connected to nasal cannula oxygen Cardiovascular status: stable and blood pressure returned to baseline Postop Assessment: no apparent nausea or vomiting Anesthetic complications: no    Wanda Plump Marsh Heckler

## 2019-12-27 NOTE — Op Note (Signed)
OPERATIVE NOTE  Richard Wilkins [id number] 12/27/2019   PREOPERATIVE DIAGNOSIS:  Nuclear sclerotic cataract right eye.  H25.11   POSTOPERATIVE DIAGNOSIS:    Nuclear sclerotic cataract right eye.     PROCEDURE:  Phacoemusification with posterior chamber intraocular lens placement of the right eye   LENS:   Implant Name Type Inv. Item Serial No. Manufacturer Lot No. LRB No. Used Action  LENS IOL DIOP 21.0 - [id number] Intraocular Lens LENS IOL DIOP 21.0 [id number]  Right 1 Implanted       Procedure(s) with comments: CATARACT EXTRACTION PHACO AND INTRAOCULAR LENS PLACEMENT (IOC) RIGHT (Right) - CDE 9.84 [place] 1:03.2  PCB00 +21.0   ULTRASOUND TIME: 1 minutes 03 seconds.  CDE 9.84   SURGEON:  Benay Pillow, MD, MPH  ANESTHESIOLOGIST: Anesthesiologist: Carlos American, MD CRNA: Jeannene Patella, CRNA   ANESTHESIA:  Topical with tetracaine drops augmented with 1% preservative-free intracameral lidocaine.  ESTIMATED BLOOD LOSS: less than 1 mL.   COMPLICATIONS:  None.   DESCRIPTION OF PROCEDURE:  The patient was identified in the holding room and transported to the operating room and placed in the supine position under the operating microscope.  The right eye was identified as the operative eye and it was prepped and draped in the usual sterile ophthalmic fashion.   A 1.0 millimeter clear-corneal paracentesis was made at the 10:30 position. 0.5 ml of preservative-free 1% lidocaine with epinephrine was injected into the anterior chamber.  The anterior chamber was filled with Healon 5 viscoelastic.  A 2.4 millimeter keratome was used to make a near-clear corneal incision at the 8:00 position.  A curvilinear capsulorrhexis was made with a cystotome and capsulorrhexis forceps.  Balanced salt solution was used to hydrodissect and hydrodelineate the nucleus.   Phacoemulsification was then used in stop and chop fashion to remove the lens nucleus and epinucleus.  The remaining cortex was  then removed using the irrigation and aspiration handpiece. Healon was then placed into the capsular bag to distend it for lens placement.  A lens was then injected into the capsular bag.  The remaining viscoelastic was aspirated.   Wounds were hydrated with balanced salt solution.  The anterior chamber was inflated to a physiologic pressure with balanced salt solution.   Intracameral vigamox 0.1 mL undiluted was injected into the eye and a drop placed onto the ocular surface.  No wound leaks were noted.  The patient was taken to the recovery room in stable condition without complications of anesthesia or surgery  Richard Wilkins 12/27/2019, 9:01 AM

## 2019-12-27 NOTE — Anesthesia Procedure Notes (Signed)
Procedure Name: MAC Date/Time: 12/27/2019 8:41 AM Performed by: Jeannene Patella, CRNA Pre-anesthesia Checklist: Patient identified, Emergency Drugs available, Suction available, Patient being monitored and Timeout performed Patient Re-evaluated:Patient Re-evaluated prior to induction Oxygen Delivery Method: Nasal cannula

## 2019-12-27 NOTE — H&P (Signed)

## 2019-12-27 NOTE — Transfer of Care (Signed)
Immediate Anesthesia Transfer of Care Note  Patient: Richard Wilkins  Procedure(s) Performed: CATARACT EXTRACTION PHACO AND INTRAOCULAR LENS PLACEMENT (IOC) RIGHT (Right Eye)  Patient Location: PACU  Anesthesia Type: MAC  Level of Consciousness: awake, alert  and patient cooperative  Airway and Oxygen Therapy: Patient Spontanous Breathing and Patient connected to supplemental oxygen  Post-op Assessment: Post-op Vital signs reviewed, Patient's Cardiovascular Status Stable, Respiratory Function Stable, Patent Airway and No signs of Nausea or vomiting  Post-op Vital Signs: Reviewed and stable  Complications: No apparent anesthesia complications

## 2019-12-27 NOTE — Anesthesia Preprocedure Evaluation (Addendum)
Anesthesia Evaluation  Patient identified by MRN, date of birth, ID band Patient awake    Reviewed: Allergy & Precautions, NPO status   Airway Mallampati: II  TM Distance: >3 FB Neck ROM: Full    Dental   Pulmonary sleep apnea , former smoker,    Pulmonary exam normal        Cardiovascular hypertension, + CAD, + Past MI and + Peripheral Vascular Disease  + dysrhythmias Atrial Fibrillation  Rhythm:Irregular     Neuro/Psych    GI/Hepatic   Endo/Other    Renal/GU CRFRenal disease     Musculoskeletal  (+) Arthritis ,   Abdominal   Peds  Hematology   Anesthesia Other Findings   Reproductive/Obstetrics                            Anesthesia Physical Anesthesia Plan  ASA: III  Anesthesia Plan: MAC   Post-op Pain Management:    Induction: Intravenous  PONV Risk Score and Plan:   Airway Management Planned: Natural Airway  Additional Equipment:   Intra-op Plan:   Post-operative Plan:   Informed Consent: I have reviewed the patients History and Physical, chart, labs and discussed the procedure including the risks, benefits and alternatives for the proposed anesthesia with the patient or authorized representative who has indicated his/her understanding and acceptance.       Plan Discussed with: CRNA, Anesthesiologist and Surgeon  Anesthesia Plan Comments:         Anesthesia Quick Evaluation

## 2019-12-28 ENCOUNTER — Encounter: Payer: Self-pay | Admitting: *Deleted

## 2020-01-03 DIAGNOSIS — I482 Chronic atrial fibrillation, unspecified: Secondary | ICD-10-CM | POA: Diagnosis not present

## 2020-01-05 DIAGNOSIS — I4891 Unspecified atrial fibrillation: Secondary | ICD-10-CM | POA: Diagnosis not present

## 2020-01-05 DIAGNOSIS — H2512 Age-related nuclear cataract, left eye: Secondary | ICD-10-CM | POA: Diagnosis not present

## 2020-01-09 ENCOUNTER — Ambulatory Visit: Payer: PPO | Attending: Internal Medicine

## 2020-01-09 DIAGNOSIS — Z23 Encounter for immunization: Secondary | ICD-10-CM | POA: Insufficient documentation

## 2020-01-09 NOTE — Progress Notes (Signed)
   Covid-19 Vaccination Clinic  Name:  Richard Wilkins    MRN: [id number] DOB: [birth year]  01/09/2020  [name] was observed post Covid-19 immunization for 15 minutes without incidence. He was provided with Vaccine Information Sheet and instruction to access the V-Safe system.   [name] was instructed to call 911 with any severe reactions post vaccine: [name] Kitchen Difficulty breathing  . Swelling of your face and throat  . A fast heartbeat  . A bad rash all over your body  . Dizziness and weakness    Immunizations Administered    Name Date Dose VIS Date Route   Pfizer COVID-19 Vaccine 01/09/2020  3:31 PM 0.3 mL 10/22/2019 Intramuscular   Manufacturer: [place]   Lot: [id number]   [place]: [id number]

## 2020-01-11 ENCOUNTER — Encounter: Payer: Self-pay | Admitting: Anesthesiology

## 2020-01-11 ENCOUNTER — Other Ambulatory Visit: Payer: Self-pay

## 2020-01-11 ENCOUNTER — Encounter: Payer: Self-pay | Admitting: Ophthalmology

## 2020-01-13 ENCOUNTER — Other Ambulatory Visit: Payer: Self-pay

## 2020-01-13 ENCOUNTER — Other Ambulatory Visit
Admission: RE | Admit: 2020-01-13 | Discharge: 2020-01-13 | Disposition: A | Discharge status: Home / Self Care | Payer: PPO | Source: Ambulatory Visit | Attending: Ophthalmology | Admitting: Ophthalmology

## 2020-01-13 DIAGNOSIS — Z01812 Encounter for preprocedural laboratory examination: Secondary | ICD-10-CM | POA: Insufficient documentation

## 2020-01-13 DIAGNOSIS — Z20822 Contact with and (suspected) exposure to covid-19: Secondary | ICD-10-CM | POA: Insufficient documentation

## 2020-01-13 LAB — SARS CORONAVIRUS 2 (TAT 6-24 HRS): SARS Coronavirus 2: NEGATIVE

## 2020-01-14 NOTE — Discharge Instructions (Signed)

## 2020-01-17 ENCOUNTER — Ambulatory Visit: Admission: RE | Admit: 2020-01-17 | Payer: PPO | Source: Home / Self Care | Admitting: Ophthalmology

## 2020-01-17 SURGERY — PHACOEMULSIFICATION, CATARACT, WITH IOL INSERTION
Anesthesia: Topical | Laterality: Left

## 2020-01-18 DIAGNOSIS — I482 Chronic atrial fibrillation, unspecified: Secondary | ICD-10-CM | POA: Diagnosis not present

## 2020-02-03 DIAGNOSIS — G4733 Obstructive sleep apnea (adult) (pediatric): Secondary | ICD-10-CM | POA: Diagnosis not present

## 2020-02-08 ENCOUNTER — Ambulatory Visit: Payer: PPO | Attending: Internal Medicine

## 2020-02-08 DIAGNOSIS — Z23 Encounter for immunization: Secondary | ICD-10-CM

## 2020-02-08 NOTE — Progress Notes (Signed)
   Covid-19 Vaccination Clinic  Name:  Richard Wilkins    MRN: [id number] DOB: May 11, 1935  02/08/2020  Richard Wilkins was observed post Covid-19 immunization for 15 minutes without incident. He was provided with Vaccine Information Sheet and instruction to access the V-Safe system.   Richard Wilkins was instructed to call 911 with any severe reactions post vaccine: [name] Kitchen Difficulty breathing  . Swelling of face and throat  . A fast heartbeat  . A bad rash all over body  . Dizziness and weakness   Immunizations Administered    Name Date Dose VIS Date Route   Pfizer COVID-19 Vaccine 02/08/2020  1:23 PM 0.3 mL 10/22/2019 Intramuscular   Manufacturer: [place]   Lot: [id number]   [place]: [id number]

## 2020-02-23 DIAGNOSIS — I482 Chronic atrial fibrillation, unspecified: Secondary | ICD-10-CM | POA: Diagnosis not present

## 2020-03-05 DIAGNOSIS — G4733 Obstructive sleep apnea (adult) (pediatric): Secondary | ICD-10-CM | POA: Diagnosis not present

## 2020-03-27 DIAGNOSIS — I482 Chronic atrial fibrillation, unspecified: Secondary | ICD-10-CM | POA: Diagnosis not present

## 2020-03-29 DIAGNOSIS — G4733 Obstructive sleep apnea (adult) (pediatric): Secondary | ICD-10-CM | POA: Diagnosis not present

## 2020-04-04 DIAGNOSIS — G4733 Obstructive sleep apnea (adult) (pediatric): Secondary | ICD-10-CM | POA: Diagnosis not present

## 2020-04-11 DIAGNOSIS — I482 Chronic atrial fibrillation, unspecified: Secondary | ICD-10-CM | POA: Diagnosis not present

## 2020-04-14 DIAGNOSIS — I482 Chronic atrial fibrillation, unspecified: Secondary | ICD-10-CM | POA: Diagnosis not present

## 2020-04-14 DIAGNOSIS — Z98818 Other dental procedure status: Secondary | ICD-10-CM | POA: Diagnosis not present

## 2020-04-26 DIAGNOSIS — I482 Chronic atrial fibrillation, unspecified: Secondary | ICD-10-CM | POA: Diagnosis not present

## 2020-04-26 DIAGNOSIS — I714 Abdominal aortic aneurysm, without rupture: Secondary | ICD-10-CM | POA: Diagnosis not present

## 2020-04-26 DIAGNOSIS — E785 Hyperlipidemia, unspecified: Secondary | ICD-10-CM | POA: Diagnosis not present

## 2020-04-26 DIAGNOSIS — G4730 Sleep apnea, unspecified: Secondary | ICD-10-CM | POA: Diagnosis not present

## 2020-04-26 DIAGNOSIS — I739 Peripheral vascular disease, unspecified: Secondary | ICD-10-CM | POA: Diagnosis not present

## 2020-04-26 DIAGNOSIS — R0602 Shortness of breath: Secondary | ICD-10-CM | POA: Diagnosis not present

## 2020-04-26 DIAGNOSIS — I10 Essential (primary) hypertension: Secondary | ICD-10-CM | POA: Diagnosis not present

## 2020-04-26 DIAGNOSIS — Z9889 Other specified postprocedural states: Secondary | ICD-10-CM | POA: Diagnosis not present

## 2020-04-26 DIAGNOSIS — I2510 Atherosclerotic heart disease of native coronary artery without angina pectoris: Secondary | ICD-10-CM | POA: Diagnosis not present

## 2020-05-03 DIAGNOSIS — M79605 Pain in left leg: Secondary | ICD-10-CM | POA: Diagnosis not present

## 2020-05-03 DIAGNOSIS — I714 Abdominal aortic aneurysm, without rupture: Secondary | ICD-10-CM | POA: Diagnosis not present

## 2020-05-03 DIAGNOSIS — I2510 Atherosclerotic heart disease of native coronary artery without angina pectoris: Secondary | ICD-10-CM | POA: Diagnosis not present

## 2020-05-03 DIAGNOSIS — G4730 Sleep apnea, unspecified: Secondary | ICD-10-CM | POA: Diagnosis not present

## 2020-05-03 DIAGNOSIS — Z0000 Encounter for general adult medical examination without abnormal findings: Secondary | ICD-10-CM | POA: Diagnosis not present

## 2020-05-03 DIAGNOSIS — I739 Peripheral vascular disease, unspecified: Secondary | ICD-10-CM | POA: Diagnosis not present

## 2020-05-03 DIAGNOSIS — R739 Hyperglycemia, unspecified: Secondary | ICD-10-CM | POA: Diagnosis not present

## 2020-05-03 DIAGNOSIS — D692 Other nonthrombocytopenic purpura: Secondary | ICD-10-CM | POA: Diagnosis not present

## 2020-05-03 DIAGNOSIS — Z23 Encounter for immunization: Secondary | ICD-10-CM | POA: Diagnosis not present

## 2020-05-03 DIAGNOSIS — E785 Hyperlipidemia, unspecified: Secondary | ICD-10-CM | POA: Diagnosis not present

## 2020-05-03 DIAGNOSIS — I10 Essential (primary) hypertension: Secondary | ICD-10-CM | POA: Diagnosis not present

## 2020-05-03 DIAGNOSIS — I482 Chronic atrial fibrillation, unspecified: Secondary | ICD-10-CM | POA: Diagnosis not present

## 2020-05-03 DIAGNOSIS — M1612 Unilateral primary osteoarthritis, left hip: Secondary | ICD-10-CM | POA: Diagnosis not present

## 2020-05-05 DIAGNOSIS — G4733 Obstructive sleep apnea (adult) (pediatric): Secondary | ICD-10-CM | POA: Diagnosis not present

## 2020-05-24 DIAGNOSIS — E538 Deficiency of other specified B group vitamins: Secondary | ICD-10-CM | POA: Insufficient documentation

## 2020-05-24 DIAGNOSIS — D692 Other nonthrombocytopenic purpura: Secondary | ICD-10-CM | POA: Diagnosis not present

## 2020-05-24 DIAGNOSIS — I482 Chronic atrial fibrillation, unspecified: Secondary | ICD-10-CM | POA: Diagnosis not present

## 2020-06-04 DIAGNOSIS — G4733 Obstructive sleep apnea (adult) (pediatric): Secondary | ICD-10-CM | POA: Diagnosis not present

## 2020-06-26 DIAGNOSIS — I482 Chronic atrial fibrillation, unspecified: Secondary | ICD-10-CM | POA: Diagnosis not present

## 2020-06-27 DIAGNOSIS — G4733 Obstructive sleep apnea (adult) (pediatric): Secondary | ICD-10-CM | POA: Diagnosis not present

## 2020-07-05 DIAGNOSIS — G4733 Obstructive sleep apnea (adult) (pediatric): Secondary | ICD-10-CM | POA: Diagnosis not present

## 2020-07-31 DIAGNOSIS — L821 Other seborrheic keratosis: Secondary | ICD-10-CM | POA: Diagnosis not present

## 2020-07-31 DIAGNOSIS — D2271 Melanocytic nevi of right lower limb, including hip: Secondary | ICD-10-CM | POA: Diagnosis not present

## 2020-07-31 DIAGNOSIS — D225 Melanocytic nevi of trunk: Secondary | ICD-10-CM | POA: Diagnosis not present

## 2020-07-31 DIAGNOSIS — D2261 Melanocytic nevi of right upper limb, including shoulder: Secondary | ICD-10-CM | POA: Diagnosis not present

## 2020-07-31 DIAGNOSIS — Z85828 Personal history of other malignant neoplasm of skin: Secondary | ICD-10-CM | POA: Diagnosis not present

## 2020-07-31 DIAGNOSIS — D2262 Melanocytic nevi of left upper limb, including shoulder: Secondary | ICD-10-CM | POA: Diagnosis not present

## 2020-07-31 DIAGNOSIS — I482 Chronic atrial fibrillation, unspecified: Secondary | ICD-10-CM | POA: Diagnosis not present

## 2020-08-05 DIAGNOSIS — G4733 Obstructive sleep apnea (adult) (pediatric): Secondary | ICD-10-CM | POA: Diagnosis not present

## 2020-08-18 ENCOUNTER — Other Ambulatory Visit: Payer: Self-pay

## 2020-08-18 ENCOUNTER — Ambulatory Visit (INDEPENDENT_AMBULATORY_CARE_PROVIDER_SITE_OTHER): Payer: PPO

## 2020-08-18 ENCOUNTER — Ambulatory Visit (INDEPENDENT_AMBULATORY_CARE_PROVIDER_SITE_OTHER): Payer: PPO | Admitting: Vascular Surgery

## 2020-08-18 ENCOUNTER — Encounter (INDEPENDENT_AMBULATORY_CARE_PROVIDER_SITE_OTHER): Payer: Self-pay | Admitting: Vascular Surgery

## 2020-08-18 VITALS — BP 143/90 | HR 91 | Resp 16 | Wt 281.0 lb

## 2020-08-18 DIAGNOSIS — I714 Abdominal aortic aneurysm, without rupture, unspecified: Secondary | ICD-10-CM

## 2020-08-18 DIAGNOSIS — I10 Essential (primary) hypertension: Secondary | ICD-10-CM | POA: Diagnosis not present

## 2020-08-18 DIAGNOSIS — E785 Hyperlipidemia, unspecified: Secondary | ICD-10-CM

## 2020-08-18 NOTE — Progress Notes (Signed)
MRN : [id number]  Richard Wilkins is a 85 y.o. (1935/04/28) male who presents with chief complaint of  Chief Complaint  Patient presents with  . Follow-up    ultrasound follow up  .  History of Present Illness: Patient returns today in follow up of his aneurysm.  He is now almost 4 years status post endovascular repair of his aneurysm.  He is doing well.  He has no aneurysm related symptoms. Specifically, the patient denies new back or abdominal pain, or signs of peripheral embolization. Duplex shows slight decrease in the sac size now measuring 4.44 cm in maximal diameter without endoleak.  Current Outpatient Medications  Medication Sig Dispense Refill  . acetaminophen (TYLENOL) 500 MG tablet Take 500-1,000 mg by mouth every 6 (six) hours as needed (for pain.).     [name] Kitchen alfuzosin (UROXATRAL) 10 MG 24 hr tablet Take 1 tablet (10 mg total) by mouth daily with breakfast.    . aspirin EC 81 MG tablet Take 81 mg by mouth daily.    . busPIRone (BUSPAR) 15 MG tablet Take 7.5-15 mg by mouth 2 (two) times daily as needed (for anxiety).     [name] Kitchen diltiazem (CARTIA XT) 180 MG 24 hr capsule TAKE ONE CAPSULE BY MOUTH ONCE DAILY    . diphenhydramine-acetaminophen (TYLENOL PM) 25-500 MG TABS tablet Take 1 tablet by mouth at bedtime as needed.    . fluticasone (FLONASE) 50 MCG/ACT nasal spray Place 1 spray into both nostrils daily as needed (for allergies.).     [name] Kitchen guaiFENesin-dextromethorphan (ROBITUSSIN DM) 100-10 MG/5ML syrup Take 5 mLs by mouth at bedtime as needed for cough.    [name] Kitchen HYDROcodone-acetaminophen (NORCO/VICODIN) 5-325 MG tablet Take 1 tablet by mouth every 6 (six) hours as needed (for lower back pain.). 20 tablet 0  . meclizine (ANTIVERT) 12.5 MG tablet TAKE 1 TO 2 TABLETS BY MOUTH THREE TIMES DAILY AS NEEDED FOR MOTION SICKNESS    . Misc Natural Products (COLON CLEANSE PO) Take by mouth.    . simvastatin (ZOCOR) 40 MG tablet TAKE ONE TABLET BY MOUTH AT BEDTIME    . warfarin (COUMADIN) 5 MG tablet  Take 5 mg by mouth at bedtime.      No current facility-administered medications for this visit.    Past Medical History:  Diagnosis Date  . Abdominal aortic aneurysm (AAA) ([place])   . Arthritis   . Atrial fibrillation ([place])   . Cancer ([place])   . Chronic back pain   . Colon polyp   . Constipation   . Coronary artery disease   . Elevated lipids   . Hemorrhage   . Hyperlipidemia   . Hypertension   . Localized swelling of both lower legs   . Myocardial infarction ([place])    2004 x 2 stents  . Sleep apnea    CPAP    Past Surgical History:  Procedure Laterality Date  . CARDIAC SURGERY    . CATARACT EXTRACTION W/PHACO Right 12/27/2019   Procedure: CATARACT EXTRACTION PHACO AND INTRAOCULAR LENS PLACEMENT ([place]) RIGHT;  Surgeon: Eulogio Bear, MD;  Location: [facility];  Service: Ophthalmology;  Laterality: Right;  CDE 9.84 [place] 1:03.2  . HEMORRHOID SURGERY    . IRRIGATION AND DEBRIDEMENT HEMATOMA    . PERIPHERAL VASCULAR CATHETERIZATION N/A 09/25/2016   Procedure: Endovascular Repair/Stent Graft;  Surgeon: Algernon Huxley, MD;  Location: [facility];  Service: Cardiovascular;  Laterality: N/A;     Social History   Tobacco Use  .  Smoking status: Former Smoker    Quit date: 11/11/2002    Years since quitting: 17.7  . Smokeless tobacco: Never Used  Vaping Use  . Vaping Use: Never used  Substance Use Topics  . Alcohol use: No  . Drug use: No     Family History  Problem Relation Age of Onset  . Cancer - Other Brother   . Prostate cancer Neg Hx   . Bladder Cancer Neg Hx   . Kidney cancer Neg Hx      No Known Allergies    REVIEW OF SYSTEMS(Negative unless checked)  Constitutional: [] ??Weight loss[] ??Fever[] ??Chills Cardiac:[] ??Chest pain[] ??Chest pressure[] ??Palpitations [] ??Shortness of breath when laying flat [] ??Shortness of breath at rest [x] ??Shortness of breath with exertion. Vascular: [] ??Pain in legs with walking[] ??Pain  in legsat rest[] ??Pain in legs when laying flat [] ??Claudication [] ??Pain in feet when walking [] ??Pain in feet at rest [] ??Pain in feet when laying flat [] ??History of DVT [] ??Phlebitis [x] ??Swelling in legs [] ??Varicose veins [] ??Non-healing ulcers Pulmonary: [] ??Uses home oxygen [] ??Productive cough[] ??Hemoptysis [] ??Wheeze [] ??COPD [] ??Asthma Neurologic: [] ??Dizziness [] ??Blackouts [] ??Seizures [] ??History of stroke [] ??History of TIA[] ??Aphasia [] ??Temporary blindness[] ??Dysphagia [] ??Weaknessor numbness in arms [] ??Weakness or numbnessin legs Musculoskeletal: [x] ??Arthritis [] ??Joint swelling [] ??Joint pain [] ??Low back pain Hematologic:[] ??Easy bruising[] ??Easy bleeding [] ??Hypercoagulable state [] ??Anemic  Gastrointestinal:[] ??Blood in stool[] ??Vomiting blood[] ??Gastroesophageal reflux/heartburn[] ??Abdominal pain Genitourinary: [] ??Chronic kidney disease [] ??Difficulturination [] ??Frequenturination [] ??Burning with urination[] ??Hematuria Skin: [] ??Rashes [] ??Ulcers [] ??Wounds Psychological: [] ??History of anxiety[] ??History of major depression.  Physical Examination  BP (!) 143/90 (BP Location: Right Arm)   Pulse 91   Resp 16   Wt 281 lb (127.5 kg)   BMI 40.32 kg/m  Gen:  WD/WN, NAD.  Obese Head: [place]/AT, No temporalis wasting. Ear/Nose/Throat: Hearing grossly intact, nares w/o erythema or drainage Eyes: Conjunctiva clear. Sclera non-icteric Neck: Supple.  Trachea midline Pulmonary:  Good air movement, no use of accessory muscles.  Cardiac: RRR, no JVD Vascular:  Vessel Right Left  Radial Palpable Palpable                          PT Palpable Palpable  DP Palpable Palpable   Gastrointestinal: soft, non-tender/non-distended. No guarding/reflex.  No increased aortic impulse Musculoskeletal: M/S 5/5 throughout.  No deformity or atrophy.  Mild lower extremity edema. Neurologic:  Sensation grossly intact in extremities.  Symmetrical.  Speech is fluent.  Psychiatric: Judgment intact, Mood & affect appropriate for pt's clinical situation. Dermatologic: No rashes or ulcers noted.  No cellulitis or open wounds.       Labs No results found for this or any previous visit (from the past 2160 hour(s)).  Radiology No results found.  Assessment/Plan Hyperlipidemia lipid control important in reducing the progression of atherosclerotic disease. Continue statin therapy   Hypertension blood pressure control important in reducing the progression of atherosclerotic disease. On appropriate oral medications.  AAA (abdominal aortic aneurysm) without rupture ([place]) Now 4 years status post endovascular repair.  Doing well.  Duplex shows slight decrease in the sac size now measuring 4.44 cm in maximal diameter without endoleak.  Continue to follow annually.   Leotis Pain, MD  08/18/2020 9:46 AM    This note was created with Dragon medical transcription system.  Any errors from dictation are purely unintentional

## 2020-08-30 DIAGNOSIS — I482 Chronic atrial fibrillation, unspecified: Secondary | ICD-10-CM | POA: Diagnosis not present

## 2020-09-04 DIAGNOSIS — G4733 Obstructive sleep apnea (adult) (pediatric): Secondary | ICD-10-CM | POA: Diagnosis not present

## 2020-09-26 DIAGNOSIS — G4733 Obstructive sleep apnea (adult) (pediatric): Secondary | ICD-10-CM | POA: Diagnosis not present

## 2020-10-02 DIAGNOSIS — I482 Chronic atrial fibrillation, unspecified: Secondary | ICD-10-CM | POA: Diagnosis not present

## 2020-10-05 DIAGNOSIS — G4733 Obstructive sleep apnea (adult) (pediatric): Secondary | ICD-10-CM | POA: Diagnosis not present

## 2020-10-23 DIAGNOSIS — E785 Hyperlipidemia, unspecified: Secondary | ICD-10-CM | POA: Diagnosis not present

## 2020-10-23 DIAGNOSIS — R0602 Shortness of breath: Secondary | ICD-10-CM | POA: Diagnosis not present

## 2020-10-23 DIAGNOSIS — I2510 Atherosclerotic heart disease of native coronary artery without angina pectoris: Secondary | ICD-10-CM | POA: Diagnosis not present

## 2020-10-23 DIAGNOSIS — I714 Abdominal aortic aneurysm, without rupture: Secondary | ICD-10-CM | POA: Diagnosis not present

## 2020-10-23 DIAGNOSIS — I10 Essential (primary) hypertension: Secondary | ICD-10-CM | POA: Diagnosis not present

## 2020-10-23 DIAGNOSIS — Z9889 Other specified postprocedural states: Secondary | ICD-10-CM | POA: Diagnosis not present

## 2020-10-23 DIAGNOSIS — G4730 Sleep apnea, unspecified: Secondary | ICD-10-CM | POA: Diagnosis not present

## 2020-10-23 DIAGNOSIS — I482 Chronic atrial fibrillation, unspecified: Secondary | ICD-10-CM | POA: Diagnosis not present

## 2020-10-23 DIAGNOSIS — Z23 Encounter for immunization: Secondary | ICD-10-CM | POA: Diagnosis not present

## 2020-10-26 DIAGNOSIS — I714 Abdominal aortic aneurysm, without rupture: Secondary | ICD-10-CM | POA: Diagnosis not present

## 2020-10-26 DIAGNOSIS — R739 Hyperglycemia, unspecified: Secondary | ICD-10-CM | POA: Diagnosis not present

## 2020-10-26 DIAGNOSIS — I10 Essential (primary) hypertension: Secondary | ICD-10-CM | POA: Diagnosis not present

## 2020-10-26 DIAGNOSIS — D692 Other nonthrombocytopenic purpura: Secondary | ICD-10-CM | POA: Diagnosis not present

## 2020-10-26 DIAGNOSIS — I2510 Atherosclerotic heart disease of native coronary artery without angina pectoris: Secondary | ICD-10-CM | POA: Diagnosis not present

## 2020-10-26 DIAGNOSIS — E785 Hyperlipidemia, unspecified: Secondary | ICD-10-CM | POA: Diagnosis not present

## 2020-10-26 DIAGNOSIS — I482 Chronic atrial fibrillation, unspecified: Secondary | ICD-10-CM | POA: Diagnosis not present

## 2020-10-31 DIAGNOSIS — H6123 Impacted cerumen, bilateral: Secondary | ICD-10-CM | POA: Diagnosis not present

## 2020-11-02 DIAGNOSIS — I739 Peripheral vascular disease, unspecified: Secondary | ICD-10-CM | POA: Diagnosis not present

## 2020-11-02 DIAGNOSIS — E785 Hyperlipidemia, unspecified: Secondary | ICD-10-CM | POA: Diagnosis not present

## 2020-11-02 DIAGNOSIS — D692 Other nonthrombocytopenic purpura: Secondary | ICD-10-CM | POA: Diagnosis not present

## 2020-11-02 DIAGNOSIS — I714 Abdominal aortic aneurysm, without rupture: Secondary | ICD-10-CM | POA: Diagnosis not present

## 2020-11-02 DIAGNOSIS — I482 Chronic atrial fibrillation, unspecified: Secondary | ICD-10-CM | POA: Diagnosis not present

## 2020-11-02 DIAGNOSIS — I2510 Atherosclerotic heart disease of native coronary artery without angina pectoris: Secondary | ICD-10-CM | POA: Diagnosis not present

## 2020-11-02 DIAGNOSIS — G4730 Sleep apnea, unspecified: Secondary | ICD-10-CM | POA: Diagnosis not present

## 2020-11-02 DIAGNOSIS — E538 Deficiency of other specified B group vitamins: Secondary | ICD-10-CM | POA: Diagnosis not present

## 2020-11-02 DIAGNOSIS — I10 Essential (primary) hypertension: Secondary | ICD-10-CM | POA: Diagnosis not present

## 2020-11-04 DIAGNOSIS — G4733 Obstructive sleep apnea (adult) (pediatric): Secondary | ICD-10-CM | POA: Diagnosis not present

## 2020-11-28 DIAGNOSIS — I482 Chronic atrial fibrillation, unspecified: Secondary | ICD-10-CM | POA: Diagnosis not present

## 2020-12-25 DIAGNOSIS — G4733 Obstructive sleep apnea (adult) (pediatric): Secondary | ICD-10-CM | POA: Diagnosis not present

## 2021-01-01 DIAGNOSIS — I482 Chronic atrial fibrillation, unspecified: Secondary | ICD-10-CM | POA: Diagnosis not present

## 2021-01-29 DIAGNOSIS — I482 Chronic atrial fibrillation, unspecified: Secondary | ICD-10-CM | POA: Diagnosis not present

## 2021-02-26 DIAGNOSIS — I482 Chronic atrial fibrillation, unspecified: Secondary | ICD-10-CM | POA: Diagnosis not present

## 2021-03-28 DIAGNOSIS — I482 Chronic atrial fibrillation, unspecified: Secondary | ICD-10-CM | POA: Diagnosis not present

## 2021-03-29 DIAGNOSIS — H2512 Age-related nuclear cataract, left eye: Secondary | ICD-10-CM | POA: Diagnosis not present

## 2021-04-17 DIAGNOSIS — R0602 Shortness of breath: Secondary | ICD-10-CM | POA: Diagnosis not present

## 2021-04-17 DIAGNOSIS — E785 Hyperlipidemia, unspecified: Secondary | ICD-10-CM | POA: Diagnosis not present

## 2021-04-17 DIAGNOSIS — I482 Chronic atrial fibrillation, unspecified: Secondary | ICD-10-CM | POA: Diagnosis not present

## 2021-04-17 DIAGNOSIS — I2510 Atherosclerotic heart disease of native coronary artery without angina pectoris: Secondary | ICD-10-CM | POA: Diagnosis not present

## 2021-04-27 DIAGNOSIS — I482 Chronic atrial fibrillation, unspecified: Secondary | ICD-10-CM | POA: Diagnosis not present

## 2021-04-27 DIAGNOSIS — E785 Hyperlipidemia, unspecified: Secondary | ICD-10-CM | POA: Diagnosis not present

## 2021-04-27 DIAGNOSIS — I10 Essential (primary) hypertension: Secondary | ICD-10-CM | POA: Diagnosis not present

## 2021-04-27 DIAGNOSIS — E538 Deficiency of other specified B group vitamins: Secondary | ICD-10-CM | POA: Diagnosis not present

## 2021-05-04 DIAGNOSIS — I739 Peripheral vascular disease, unspecified: Secondary | ICD-10-CM | POA: Diagnosis not present

## 2021-05-04 DIAGNOSIS — G4730 Sleep apnea, unspecified: Secondary | ICD-10-CM | POA: Diagnosis not present

## 2021-05-04 DIAGNOSIS — E785 Hyperlipidemia, unspecified: Secondary | ICD-10-CM | POA: Diagnosis not present

## 2021-05-04 DIAGNOSIS — I2510 Atherosclerotic heart disease of native coronary artery without angina pectoris: Secondary | ICD-10-CM | POA: Diagnosis not present

## 2021-05-04 DIAGNOSIS — D6869 Other thrombophilia: Secondary | ICD-10-CM | POA: Insufficient documentation

## 2021-05-04 DIAGNOSIS — I482 Chronic atrial fibrillation, unspecified: Secondary | ICD-10-CM | POA: Diagnosis not present

## 2021-05-04 DIAGNOSIS — I10 Essential (primary) hypertension: Secondary | ICD-10-CM | POA: Diagnosis not present

## 2021-05-04 DIAGNOSIS — E538 Deficiency of other specified B group vitamins: Secondary | ICD-10-CM | POA: Diagnosis not present

## 2021-05-04 DIAGNOSIS — I714 Abdominal aortic aneurysm, without rupture: Secondary | ICD-10-CM | POA: Diagnosis not present

## 2021-05-04 DIAGNOSIS — D692 Other nonthrombocytopenic purpura: Secondary | ICD-10-CM | POA: Diagnosis not present

## 2021-05-04 DIAGNOSIS — Z0000 Encounter for general adult medical examination without abnormal findings: Secondary | ICD-10-CM | POA: Diagnosis not present

## 2021-05-04 DIAGNOSIS — Z79891 Long term (current) use of opiate analgesic: Secondary | ICD-10-CM | POA: Diagnosis not present

## 2021-05-10 DIAGNOSIS — H2512 Age-related nuclear cataract, left eye: Secondary | ICD-10-CM | POA: Diagnosis not present

## 2021-05-15 ENCOUNTER — Encounter: Payer: Self-pay | Admitting: Anesthesiology

## 2021-05-15 ENCOUNTER — Encounter: Payer: Self-pay | Admitting: Ophthalmology

## 2021-05-22 DIAGNOSIS — S8011XD Contusion of right lower leg, subsequent encounter: Secondary | ICD-10-CM | POA: Diagnosis not present

## 2021-05-22 DIAGNOSIS — I10 Essential (primary) hypertension: Secondary | ICD-10-CM | POA: Diagnosis not present

## 2021-05-22 DIAGNOSIS — M7989 Other specified soft tissue disorders: Secondary | ICD-10-CM | POA: Diagnosis not present

## 2021-05-22 DIAGNOSIS — I482 Chronic atrial fibrillation, unspecified: Secondary | ICD-10-CM | POA: Diagnosis not present

## 2021-05-28 ENCOUNTER — Ambulatory Visit: Admission: RE | Admit: 2021-05-28 | Payer: PPO | Source: Home / Self Care | Admitting: Ophthalmology

## 2021-05-28 SURGERY — PHACOEMULSIFICATION, CATARACT, WITH IOL INSERTION
Anesthesia: Topical | Laterality: Left

## 2021-06-05 DIAGNOSIS — I482 Chronic atrial fibrillation, unspecified: Secondary | ICD-10-CM | POA: Diagnosis not present

## 2021-06-12 DIAGNOSIS — G4733 Obstructive sleep apnea (adult) (pediatric): Secondary | ICD-10-CM | POA: Diagnosis not present

## 2021-07-09 DIAGNOSIS — I482 Chronic atrial fibrillation, unspecified: Secondary | ICD-10-CM | POA: Diagnosis not present

## 2021-07-31 DIAGNOSIS — D485 Neoplasm of uncertain behavior of skin: Secondary | ICD-10-CM | POA: Diagnosis not present

## 2021-07-31 DIAGNOSIS — D2271 Melanocytic nevi of right lower limb, including hip: Secondary | ICD-10-CM | POA: Diagnosis not present

## 2021-07-31 DIAGNOSIS — Z85828 Personal history of other malignant neoplasm of skin: Secondary | ICD-10-CM | POA: Diagnosis not present

## 2021-07-31 DIAGNOSIS — D2262 Melanocytic nevi of left upper limb, including shoulder: Secondary | ICD-10-CM | POA: Diagnosis not present

## 2021-07-31 DIAGNOSIS — L570 Actinic keratosis: Secondary | ICD-10-CM | POA: Diagnosis not present

## 2021-07-31 DIAGNOSIS — D2261 Melanocytic nevi of right upper limb, including shoulder: Secondary | ICD-10-CM | POA: Diagnosis not present

## 2021-07-31 DIAGNOSIS — R208 Other disturbances of skin sensation: Secondary | ICD-10-CM | POA: Diagnosis not present

## 2021-07-31 DIAGNOSIS — X32XXXA Exposure to sunlight, initial encounter: Secondary | ICD-10-CM | POA: Diagnosis not present

## 2021-07-31 DIAGNOSIS — D225 Melanocytic nevi of trunk: Secondary | ICD-10-CM | POA: Diagnosis not present

## 2021-07-31 DIAGNOSIS — L720 Epidermal cyst: Secondary | ICD-10-CM | POA: Diagnosis not present

## 2021-08-13 DIAGNOSIS — I482 Chronic atrial fibrillation, unspecified: Secondary | ICD-10-CM | POA: Diagnosis not present

## 2021-08-17 ENCOUNTER — Ambulatory Visit (INDEPENDENT_AMBULATORY_CARE_PROVIDER_SITE_OTHER): Payer: PPO | Admitting: Nurse Practitioner

## 2021-08-17 ENCOUNTER — Ambulatory Visit (INDEPENDENT_AMBULATORY_CARE_PROVIDER_SITE_OTHER): Payer: PPO

## 2021-08-17 ENCOUNTER — Other Ambulatory Visit: Payer: Self-pay

## 2021-08-17 VITALS — BP 112/79 | HR 125 | Ht 70.0 in | Wt 271.0 lb

## 2021-08-17 DIAGNOSIS — I714 Abdominal aortic aneurysm, without rupture, unspecified: Secondary | ICD-10-CM

## 2021-08-17 DIAGNOSIS — E785 Hyperlipidemia, unspecified: Secondary | ICD-10-CM | POA: Diagnosis not present

## 2021-08-17 DIAGNOSIS — I10 Essential (primary) hypertension: Secondary | ICD-10-CM

## 2021-08-18 ENCOUNTER — Encounter (INDEPENDENT_AMBULATORY_CARE_PROVIDER_SITE_OTHER): Payer: Self-pay | Admitting: Nurse Practitioner

## 2021-08-18 NOTE — Progress Notes (Signed)
Subjective:    Patient ID: Richard Wilkins, male    DOB: 1935/03/25, 86 y.o.   MRN: [id number] Chief Complaint  Patient presents with   Follow-up    [place] 55yr evar    Richard Wilkins is an 86 year old male that returns to the office for surveillance of an abdominal aortic aneurysm status post stent graft placement on 09/25/2016.   Patient denies abdominal pain or back pain, no other abdominal complaints. No groin related complaints. No symptoms consistent with distal embolization No changes in claudication distance.   There have been no interval changes in his overall healthcare since his last visit.   Patient denies amaurosis fugax or TIA symptoms. There is no history of claudication or rest pain symptoms of the lower extremities. The patient denies angina or shortness of breath.   Duplex US of the aorta and iliac arteries shows a 4.99 AAA sac with no endoleak, there is no growth from previous exam where it was noted to be 4.4 cm.  However previous study on 08/17/2019 noted 4.8 cm.   Review of Systems  Gastrointestinal:  Negative for abdominal pain.  All other systems reviewed and are negative.     Objective:   Physical Exam Vitals reviewed.  HENT:     Head: Normocephalic.  Cardiovascular:     Rate and Rhythm: Normal rate.     Pulses: Normal pulses.  Pulmonary:     Effort: Pulmonary effort is normal.  Skin:    General: Skin is warm and dry.  Neurological:     Mental Status: He is alert and oriented to person, place, and time.     Motor: Weakness present.     Gait: Gait abnormal.  Psychiatric:        Mood and Affect: Mood normal.        Behavior: Behavior normal.        Thought Content: Thought content normal.        Judgment: Judgment normal.    BP 112/79   Pulse (!) 125   Ht 5\' 10"  (1.778 m)   Wt 271 lb (122.9 kg)   BMI 38.88 kg/m   Past Medical History:  Diagnosis Date   Abdominal aortic aneurysm (AAA)    Arthritis    Atrial fibrillation ([facility])    Cancer ([facility])     Chronic back pain    Colon polyp    Constipation    Coronary artery disease    Elevated lipids    Hemorrhage    Hyperlipidemia    Hypertension    Localized swelling of both lower legs    Myocardial infarction ([place])    2004 x 2 stents   Sleep apnea    CPAP    Social History   Socioeconomic History   Marital status: Married    Spouse name: Not on file   Number of children: Not on file   Years of education: Not on file   Highest education level: Not on file  Occupational History   Occupation: retired  Tobacco Use   Smoking status: Former    Types: Cigarettes    Quit date: 11/11/2002    Years since quitting: 18.7   Smokeless tobacco: Never  Vaping Use   Vaping Use: Never used  Substance and Sexual Activity   Alcohol use: No   Drug use: No   Sexual activity: Not on file  Other Topics Concern   Not on file  Social History Narrative   Not on file  Social Determinants of Health   Financial Resource Strain: Not on file  Food Insecurity: Not on file  Transportation Needs: Not on file  Physical Activity: Not on file  Stress: Not on file  Social Connections: Not on file  Intimate Partner Violence: Not on file    Past Surgical History:  Procedure Laterality Date   CARDIAC SURGERY     CATARACT EXTRACTION W/PHACO Right 12/27/2019   Procedure: CATARACT EXTRACTION PHACO AND INTRAOCULAR LENS PLACEMENT ([place]) RIGHT;  Surgeon: Eulogio Bear, MD;  Location: [facility];  Service: Ophthalmology;  Laterality: Right;  CDE 9.84 [place] 1:03.2   HEMORRHOID SURGERY     IRRIGATION AND DEBRIDEMENT HEMATOMA     PERIPHERAL VASCULAR CATHETERIZATION N/A 09/25/2016   Procedure: Endovascular Repair/Stent Graft;  Surgeon: Algernon Huxley, MD;  Location: [facility];  Service: Cardiovascular;  Laterality: N/A;    Family History  Problem Relation Age of Onset   Cancer - Other Brother    Prostate cancer Neg Hx    Bladder Cancer Neg Hx    Kidney cancer Neg Hx     No Known  Allergies  CBC Latest Ref Rng & Units 09/26/2016 09/25/2016 08/09/2016  WBC 3.8 - 10.6 K/uL 13.9(H) 9.2 10.5  Hemoglobin 13.0 - 18.0 g/dL 13.8 14.5 15.1  Hematocrit 40.0 - 52.0 % 40.2 42.5 44.2  Platelets 150 - 440 K/uL 217 215 223      CMP     Component Value Date/Time   NA 137 09/26/2016 0315   NA 140 10/21/2014 0535   K 4.0 09/26/2016 0315   K 3.7 10/21/2014 0535   CL 105 09/26/2016 0315   CL 107 10/21/2014 0535   CO2 27 09/26/2016 0315   CO2 28 10/21/2014 0535   GLUCOSE 134 (H) 09/26/2016 0315   GLUCOSE 101 (H) 10/21/2014 0535   BUN 12 09/26/2016 0315   BUN 13 10/21/2014 0535   CREATININE 0.64 09/26/2016 0315   CREATININE 0.64 10/21/2014 0535   CALCIUM 8.9 09/26/2016 0315   CALCIUM 8.3 (L) 10/21/2014 0535   PROT 7.5 08/08/2016 2156   ALBUMIN 4.4 08/08/2016 2156   AST 21 08/08/2016 2156   ALT 20 08/08/2016 2156   ALKPHOS 41 08/08/2016 2156   BILITOT 1.4 (H) 08/08/2016 2156   GFRNONAA >60 09/26/2016 0315   GFRNONAA >60 10/21/2014 0535   GFRAA >60 09/26/2016 0315   GFRAA >60 10/21/2014 0535     No results found.     Assessment & Plan:   1. Abdominal aortic aneurysm (AAA) without rupture, unspecified part Today the patient's aneurysm does grow from his previous study on 08/18/2020.  However the studies are also along with what was previously seen on 08/17/2019.  The patient is not currently having any symptoms such as abdominal pain or signs symptoms of distal embolization.  He also has a patent stent graft with no sign of endoleak.  Based on the previous 2 studies this result may be the result of a previous underestimation.  Based on this growth we will have the patient return in 6 months with noninvasive studies versus 1 year.  If continued growth is seen we may plan on a CT scan to further evaluate.  2. Primary hypertension Continue antihypertensive medications as already ordered, these medications have been reviewed and there are no changes at this time.   3.  Hyperlipidemia, unspecified hyperlipidemia type Continue statin as ordered and reviewed, no changes at this time    Current Outpatient Medications on  File Prior to Visit  Medication Sig Dispense Refill   acetaminophen (TYLENOL) 500 MG tablet Take 500-1,000 mg by mouth every 6 (six) hours as needed (for pain.).      alfuzosin (UROXATRAL) 10 MG 24 hr tablet Take 1 tablet (10 mg total) by mouth daily with breakfast.     aspirin EC 81 MG tablet Take 81 mg by mouth daily.     diltiazem (CARDIZEM CD) 180 MG 24 hr capsule TAKE ONE CAPSULE BY MOUTH ONCE DAILY     diphenhydramine-acetaminophen (TYLENOL PM) 25-500 MG TABS tablet Take 1 tablet by mouth at bedtime as needed.     fluticasone (FLONASE) 50 MCG/ACT nasal spray Place 1 spray into both nostrils daily as needed (for allergies.).      guaiFENesin-dextromethorphan (ROBITUSSIN DM) 100-10 MG/5ML syrup Take 5 mLs by mouth at bedtime as needed for cough.     HYDROcodone-acetaminophen (NORCO/VICODIN) 5-325 MG tablet Take 1 tablet by mouth every 6 (six) hours as needed (for lower back pain.). 20 tablet 0   meclizine (ANTIVERT) 12.5 MG tablet TAKE 1 TO 2 TABLETS BY MOUTH THREE TIMES DAILY AS NEEDED FOR MOTION SICKNESS     Misc Natural Products (COLON CLEANSE PO) Take by mouth.     simvastatin (ZOCOR) 40 MG tablet TAKE ONE TABLET BY MOUTH AT BEDTIME     warfarin (COUMADIN) 5 MG tablet Take 5 mg by mouth at bedtime.      No current facility-administered medications on file prior to visit.    There are no Patient Instructions on file for this visit. No follow-ups on file.   Kris Hartmann, NP

## 2021-09-13 DIAGNOSIS — I482 Chronic atrial fibrillation, unspecified: Secondary | ICD-10-CM | POA: Diagnosis not present

## 2021-09-21 DIAGNOSIS — G4733 Obstructive sleep apnea (adult) (pediatric): Secondary | ICD-10-CM | POA: Diagnosis not present

## 2021-10-18 DIAGNOSIS — G4730 Sleep apnea, unspecified: Secondary | ICD-10-CM | POA: Diagnosis not present

## 2021-10-18 DIAGNOSIS — E785 Hyperlipidemia, unspecified: Secondary | ICD-10-CM | POA: Diagnosis not present

## 2021-10-18 DIAGNOSIS — R0602 Shortness of breath: Secondary | ICD-10-CM | POA: Diagnosis not present

## 2021-10-18 DIAGNOSIS — I10 Essential (primary) hypertension: Secondary | ICD-10-CM | POA: Diagnosis not present

## 2021-10-18 DIAGNOSIS — I482 Chronic atrial fibrillation, unspecified: Secondary | ICD-10-CM | POA: Diagnosis not present

## 2021-10-18 DIAGNOSIS — I2510 Atherosclerotic heart disease of native coronary artery without angina pectoris: Secondary | ICD-10-CM | POA: Diagnosis not present

## 2021-10-18 DIAGNOSIS — I739 Peripheral vascular disease, unspecified: Secondary | ICD-10-CM | POA: Diagnosis not present

## 2021-10-18 DIAGNOSIS — Z9889 Other specified postprocedural states: Secondary | ICD-10-CM | POA: Diagnosis not present

## 2021-10-24 DIAGNOSIS — I10 Essential (primary) hypertension: Secondary | ICD-10-CM | POA: Diagnosis not present

## 2021-10-24 DIAGNOSIS — I2510 Atherosclerotic heart disease of native coronary artery without angina pectoris: Secondary | ICD-10-CM | POA: Diagnosis not present

## 2021-10-24 DIAGNOSIS — I482 Chronic atrial fibrillation, unspecified: Secondary | ICD-10-CM | POA: Diagnosis not present

## 2021-10-24 DIAGNOSIS — Z79891 Long term (current) use of opiate analgesic: Secondary | ICD-10-CM | POA: Diagnosis not present

## 2021-10-24 DIAGNOSIS — E538 Deficiency of other specified B group vitamins: Secondary | ICD-10-CM | POA: Diagnosis not present

## 2021-10-24 DIAGNOSIS — E785 Hyperlipidemia, unspecified: Secondary | ICD-10-CM | POA: Diagnosis not present

## 2021-11-13 DIAGNOSIS — I739 Peripheral vascular disease, unspecified: Secondary | ICD-10-CM | POA: Diagnosis not present

## 2021-11-13 DIAGNOSIS — E538 Deficiency of other specified B group vitamins: Secondary | ICD-10-CM | POA: Diagnosis not present

## 2021-11-13 DIAGNOSIS — I482 Chronic atrial fibrillation, unspecified: Secondary | ICD-10-CM | POA: Diagnosis not present

## 2021-11-13 DIAGNOSIS — I10 Essential (primary) hypertension: Secondary | ICD-10-CM | POA: Diagnosis not present

## 2021-11-13 DIAGNOSIS — Z79891 Long term (current) use of opiate analgesic: Secondary | ICD-10-CM | POA: Diagnosis not present

## 2021-11-13 DIAGNOSIS — D692 Other nonthrombocytopenic purpura: Secondary | ICD-10-CM | POA: Diagnosis not present

## 2021-11-13 DIAGNOSIS — E785 Hyperlipidemia, unspecified: Secondary | ICD-10-CM | POA: Diagnosis not present

## 2021-11-13 DIAGNOSIS — I2510 Atherosclerotic heart disease of native coronary artery without angina pectoris: Secondary | ICD-10-CM | POA: Diagnosis not present

## 2021-11-13 DIAGNOSIS — D6869 Other thrombophilia: Secondary | ICD-10-CM | POA: Diagnosis not present

## 2021-11-13 DIAGNOSIS — G4730 Sleep apnea, unspecified: Secondary | ICD-10-CM | POA: Diagnosis not present

## 2021-11-13 DIAGNOSIS — I714 Abdominal aortic aneurysm, without rupture, unspecified: Secondary | ICD-10-CM | POA: Diagnosis not present

## 2021-11-27 DIAGNOSIS — I482 Chronic atrial fibrillation, unspecified: Secondary | ICD-10-CM | POA: Diagnosis not present

## 2021-12-31 DIAGNOSIS — I482 Chronic atrial fibrillation, unspecified: Secondary | ICD-10-CM | POA: Diagnosis not present

## 2021-12-31 DIAGNOSIS — Z7901 Long term (current) use of anticoagulants: Secondary | ICD-10-CM | POA: Diagnosis not present

## 2021-12-31 DIAGNOSIS — G4733 Obstructive sleep apnea (adult) (pediatric): Secondary | ICD-10-CM | POA: Diagnosis not present

## 2022-01-28 DIAGNOSIS — I482 Chronic atrial fibrillation, unspecified: Secondary | ICD-10-CM | POA: Diagnosis not present

## 2022-02-15 ENCOUNTER — Other Ambulatory Visit (INDEPENDENT_AMBULATORY_CARE_PROVIDER_SITE_OTHER): Payer: PPO

## 2022-02-15 ENCOUNTER — Ambulatory Visit (INDEPENDENT_AMBULATORY_CARE_PROVIDER_SITE_OTHER): Payer: PPO | Admitting: Vascular Surgery

## 2022-02-19 ENCOUNTER — Other Ambulatory Visit (INDEPENDENT_AMBULATORY_CARE_PROVIDER_SITE_OTHER): Payer: Self-pay | Admitting: Nurse Practitioner

## 2022-02-19 DIAGNOSIS — I714 Abdominal aortic aneurysm, without rupture, unspecified: Secondary | ICD-10-CM

## 2022-02-22 ENCOUNTER — Ambulatory Visit (INDEPENDENT_AMBULATORY_CARE_PROVIDER_SITE_OTHER): Payer: PPO | Admitting: Vascular Surgery

## 2022-02-22 ENCOUNTER — Ambulatory Visit (INDEPENDENT_AMBULATORY_CARE_PROVIDER_SITE_OTHER): Payer: PPO

## 2022-02-22 ENCOUNTER — Encounter (INDEPENDENT_AMBULATORY_CARE_PROVIDER_SITE_OTHER): Payer: Self-pay | Admitting: Nurse Practitioner

## 2022-02-22 ENCOUNTER — Ambulatory Visit (INDEPENDENT_AMBULATORY_CARE_PROVIDER_SITE_OTHER): Payer: PPO | Admitting: Nurse Practitioner

## 2022-02-22 VITALS — BP 136/72 | HR 88 | Resp 17 | Ht 70.0 in | Wt 275.6 lb

## 2022-02-22 DIAGNOSIS — I714 Abdominal aortic aneurysm, without rupture, unspecified: Secondary | ICD-10-CM | POA: Diagnosis not present

## 2022-02-22 DIAGNOSIS — I10 Essential (primary) hypertension: Secondary | ICD-10-CM | POA: Diagnosis not present

## 2022-02-22 DIAGNOSIS — E785 Hyperlipidemia, unspecified: Secondary | ICD-10-CM | POA: Diagnosis not present

## 2022-02-26 ENCOUNTER — Telehealth (INDEPENDENT_AMBULATORY_CARE_PROVIDER_SITE_OTHER): Payer: Self-pay

## 2022-02-26 ENCOUNTER — Encounter (INDEPENDENT_AMBULATORY_CARE_PROVIDER_SITE_OTHER): Payer: Self-pay | Admitting: Nurse Practitioner

## 2022-02-26 NOTE — Telephone Encounter (Signed)
Spoke with the patient and gave his the information to contact Radiology to schedule his CT if they don't call and let him know to call back to schedule an appt to review the CT. ?

## 2022-02-26 NOTE — Telephone Encounter (Signed)
Patient called wanting to know about his CT and when it will be scheduled. I let the patient know that he could call radiology to schedule his CT and to call our office back to schedule his appt with Dr. Lucky Cowboy to review the CT. Patient stated he understood. ?

## 2022-02-26 NOTE — Progress Notes (Signed)
? ?Subjective:  ? ? Patient ID: Richard Wilkins, male    DOB: [birth year], 87 y.o.   MRN: [id number] ?No chief complaint on file. ? ? ?The patient returns to the office for surveillance of an abdominal aortic aneurysm status post stent graft placement on 09/25/2016.  ? ?Patient denies abdominal pain or back pain, no other abdominal complaints. No groin related complaints. No symptoms consistent with distal embolization No changes in claudication distance or new rest pain symptoms.  ? ?There have been no interval changes in his overall healthcare since his last visit.  ? ?Patient denies amaurosis fugax or TIA symptoms.  ?The patient denies recent episodes of angina or shortness of breath.  ? ?Duplex US of the aorta and iliac arteries shows a 5.6 AAA sac with no endoleak, increase in the sac compared to the previous study.  The previous ultrasound noted a 5 cm aneurysm.  This is a distinctive growth of the aneurysm despite no notable endoleak on a ultrasound ? ? ?Review of Systems  ?Gastrointestinal:  Negative for abdominal pain.  ?All other systems reviewed and are negative. ? ?   ?Objective:  ? Physical Exam ?Vitals reviewed.  ?HENT:  ?   Head: Normocephalic.  ?Cardiovascular:  ?   Rate and Rhythm: Normal rate.  ?   Pulses:     ?     Radial pulses are 1+ on the right side and 1+ on the left side.  ?Pulmonary:  ?   Effort: Pulmonary effort is normal.  ?Skin: ?   General: Skin is warm and dry.  ?Neurological:  ?   Mental Status: He is alert and oriented to person, place, and time.  ?Psychiatric:     ?   Mood and Affect: Mood normal.     ?   Behavior: Behavior normal.     ?   Thought Content: Thought content normal.     ?   Judgment: Judgment normal.  ? ? ?BP 136/72 (BP Location: Left Arm)   Pulse 88   Resp 17   Ht '5\' 10"'$  (1.778 m)   Wt 275 lb 9.6 oz (125 kg)   BMI 39.54 kg/m?  ? ?Past Medical History:  ?Diagnosis Date  ? Abdominal aortic aneurysm (AAA) ([place])   ? Arthritis   ? Atrial fibrillation ([place])   ? Cancer  [facility])   ? Chronic back pain   ? Colon polyp   ? Constipation   ? Coronary artery disease   ? Elevated lipids   ? Hemorrhage   ? Hyperlipidemia   ? Hypertension   ? Localized swelling of both lower legs   ? Myocardial infarction [facility])   ? 2004 x 2 stents  ? Sleep apnea   ? CPAP  ? ? ?Social History  ? ?Socioeconomic History  ? Marital status: Married  ?  Spouse name: Not on file  ? Number of children: Not on file  ? Years of education: Not on file  ? Highest education level: Not on file  ?Occupational History  ? Occupation: retired  ?Tobacco Use  ? Smoking status: Former  ?  Types: Cigarettes  ?  Quit date: 11/11/2002  ?  Years since quitting: 19.3  ? Smokeless tobacco: Never  ?Vaping Use  ? Vaping Use: Never used  ?Substance and Sexual Activity  ? Alcohol use: No  ? Drug use: No  ? Sexual activity: Not on file  ?Other Topics Concern  ? Not on file  ?Social History Narrative  ?  Not on file  ? ?Social Determinants of Health  ? ?Financial Resource Strain: Not on file  ?Food Insecurity: Not on file  ?Transportation Needs: Not on file  ?Physical Activity: Not on file  ?Stress: Not on file  ?Social Connections: Not on file  ?Intimate Partner Violence: Not on file  ? ? ?Past Surgical History:  ?Procedure Laterality Date  ? CARDIAC SURGERY    ? CATARACT EXTRACTION W/PHACO Right 12/27/2019  ? Procedure: CATARACT EXTRACTION PHACO AND INTRAOCULAR LENS PLACEMENT ([place]) RIGHT;  Surgeon: Eulogio Bear, MD;  Location: [facility];  Service: Ophthalmology;  Laterality: Right;  CDE 9.84 ?[place] 1:03.2  ? HEMORRHOID SURGERY    ? IRRIGATION AND DEBRIDEMENT HEMATOMA    ? PERIPHERAL VASCULAR CATHETERIZATION N/A 09/25/2016  ? Procedure: Endovascular Repair/Stent Graft;  Surgeon: Algernon Huxley, MD;  Location: [facility];  Service: Cardiovascular;  Laterality: N/A;  ? ? ?Family History  ?Problem Relation Age of Onset  ? Cancer - Other Brother   ? Prostate cancer Neg Hx   ? Bladder Cancer Neg Hx   ? Kidney cancer Neg Hx    ? ? ?No Known Allergies ? ? ?  Latest Ref Rng & Units 09/26/2016  ?  3:15 AM 09/25/2016  ?  1:14 PM 08/09/2016  ?  5:10 AM  ?CBC  ?WBC 3.8 - 10.6 K/uL 13.9   9.2   10.5    ?Hemoglobin 13.0 - 18.0 g/dL 13.8   14.5   15.1    ?Hematocrit 40.0 - 52.0 % 40.2   42.5   44.2    ?Platelets 150 - 440 K/uL 217   215   223    ? ? ? ? ?CMP  ?   ?Component Value Date/Time  ? NA 137 09/26/2016 0315  ? NA 140 10/21/2014 0535  ? K 4.0 09/26/2016 0315  ? K 3.7 10/21/2014 0535  ? CL 105 09/26/2016 0315  ? CL 107 10/21/2014 0535  ? CO2 27 09/26/2016 0315  ? CO2 28 10/21/2014 0535  ? GLUCOSE 134 (H) 09/26/2016 0315  ? GLUCOSE 101 (H) 10/21/2014 0535  ? BUN 12 09/26/2016 0315  ? BUN 13 10/21/2014 0535  ? CREATININE 0.64 09/26/2016 0315  ? CREATININE 0.64 10/21/2014 0535  ? CALCIUM 8.9 09/26/2016 0315  ? CALCIUM 8.3 (L) 10/21/2014 0535  ? PROT 7.5 08/08/2016 2156  ? ALBUMIN 4.4 08/08/2016 2156  ? AST 21 08/08/2016 2156  ? ALT 20 08/08/2016 2156  ? ALKPHOS 41 08/08/2016 2156  ? BILITOT 1.4 (H) 08/08/2016 2156  ? GFRNONAA >60 09/26/2016 0315  ? GFRNONAA >60 10/21/2014 0535  ? GFRAA >60 09/26/2016 0315  ? GFRAA >60 10/21/2014 0535  ? ? ? ?No results found. ? ?   ?Assessment & Plan:  ? ?1. Abdominal aortic aneurysm (AAA) without rupture, unspecified part ([place]) ?Recommend:  ?The patient has an abdominal aortic aneurysm that has a previous endovascular aortic aneurysm repair with notable growth from his previous ultrasound 1 year ago. ?The patient is otherwise in reasonable health.  ? ?Patient will require CT angiography of the abdomen and pelvis in order to appropriately plan evaluate for possible presence of endoleak as well as possible repair of the AAA. ? ?The risks and benefits as well as the alternative therapies was discussed in detail with the patient. All questions were answered.  Therefore a CT angiogram with be scheduled as an outpatient. ? ?The patient will follow up with me in the office after the  CT scan t ?- CT Angio Abd/Pel w/  and/or w/o; Future ? ?2. Primary hypertension ?Continue antihypertensive medications as already ordered, these medications have been reviewed and there are no changes at this time.  ? ?3. Hyperlipidemia, unspecified hyperlipidemia type ?Continue statin as ordered and reviewed, no changes at this time  ? ? ?Current Outpatient Medications on File Prior to Visit  ?Medication Sig Dispense Refill  ? acetaminophen (TYLENOL) 500 MG tablet Take 500-1,000 mg by mouth every 6 (six) hours as needed (for pain.).     ? alfuzosin (UROXATRAL) 10 MG 24 hr tablet Take 1 tablet (10 mg total) by mouth daily with breakfast.    ? aspirin EC 81 MG tablet Take 81 mg by mouth daily.    ? diltiazem (CARDIZEM CD) 180 MG 24 hr capsule TAKE ONE CAPSULE BY MOUTH ONCE DAILY    ? diphenhydramine-acetaminophen (TYLENOL PM) 25-500 MG TABS tablet Take 1 tablet by mouth at bedtime as needed.    ? fluticasone (FLONASE) 50 MCG/ACT nasal spray Place 1 spray into both nostrils daily as needed (for allergies.).     ? guaiFENesin-dextromethorphan (ROBITUSSIN DM) 100-10 MG/5ML syrup Take 5 mLs by mouth at bedtime as needed for cough.    ? HYDROcodone-acetaminophen (NORCO/VICODIN) 5-325 MG tablet Take 1 tablet by mouth every 6 (six) hours as needed (for lower back pain.). 20 tablet 0  ? meclizine (ANTIVERT) 12.5 MG tablet TAKE 1 TO 2 TABLETS BY MOUTH THREE TIMES DAILY AS NEEDED FOR MOTION SICKNESS    ? Misc Natural Products (COLON CLEANSE PO) Take by mouth.    ? simvastatin (ZOCOR) 40 MG tablet TAKE ONE TABLET BY MOUTH AT BEDTIME    ? warfarin (COUMADIN) 5 MG tablet Take 5 mg by mouth at bedtime.     ? PFIZER COVID-19 VAC BIVALENT injection     ? ?No current facility-administered medications on file prior to visit.  ? ? ?There are no Patient Instructions on file for this visit. ?No follow-ups on file. ? ? ?Kris Hartmann, NP ? ? ?

## 2022-02-26 NOTE — Telephone Encounter (Signed)
Patient called in wanting to check the status of his CT... and wanting to make sure that Dr. Lucky Cowboy also received the information from NP from his last visit with her  ? ? ?Please call and advise  ?

## 2022-02-28 DIAGNOSIS — I482 Chronic atrial fibrillation, unspecified: Secondary | ICD-10-CM | POA: Diagnosis not present

## 2022-03-07 DIAGNOSIS — R21 Rash and other nonspecific skin eruption: Secondary | ICD-10-CM | POA: Diagnosis not present

## 2022-03-07 DIAGNOSIS — I739 Peripheral vascular disease, unspecified: Secondary | ICD-10-CM | POA: Diagnosis not present

## 2022-03-07 DIAGNOSIS — G4730 Sleep apnea, unspecified: Secondary | ICD-10-CM | POA: Diagnosis not present

## 2022-03-07 DIAGNOSIS — D6869 Other thrombophilia: Secondary | ICD-10-CM | POA: Diagnosis not present

## 2022-03-07 DIAGNOSIS — I10 Essential (primary) hypertension: Secondary | ICD-10-CM | POA: Diagnosis not present

## 2022-03-07 DIAGNOSIS — I482 Chronic atrial fibrillation, unspecified: Secondary | ICD-10-CM | POA: Diagnosis not present

## 2022-03-08 ENCOUNTER — Ambulatory Visit
Admission: RE | Admit: 2022-03-08 | Discharge: 2022-03-08 | Disposition: A | Discharge status: Home / Self Care | Payer: PPO | Source: Ambulatory Visit | Attending: Nurse Practitioner | Admitting: Nurse Practitioner

## 2022-03-08 DIAGNOSIS — I714 Abdominal aortic aneurysm, without rupture, unspecified: Secondary | ICD-10-CM

## 2022-03-08 LAB — POCT I-STAT CREATININE: Creatinine, Ser: 0.6 mg/dL — ABNORMAL LOW (ref 0.61–1.24)

## 2022-03-08 MED ORDER — IOHEXOL 350 MG/ML SOLN
100.0000 mL | Freq: Once | INTRAVENOUS | Status: AC | PRN
  Administered 2022-03-08: 100 mL via INTRAVENOUS

## 2022-03-12 ENCOUNTER — Ambulatory Visit (INDEPENDENT_AMBULATORY_CARE_PROVIDER_SITE_OTHER): Payer: PPO | Admitting: Vascular Surgery

## 2022-03-12 ENCOUNTER — Encounter (INDEPENDENT_AMBULATORY_CARE_PROVIDER_SITE_OTHER): Payer: Self-pay | Admitting: Vascular Surgery

## 2022-03-12 VITALS — BP 140/82 | HR 70 | Resp 17 | Ht 70.0 in | Wt 275.0 lb

## 2022-03-12 DIAGNOSIS — E785 Hyperlipidemia, unspecified: Secondary | ICD-10-CM | POA: Diagnosis not present

## 2022-03-12 DIAGNOSIS — I10 Essential (primary) hypertension: Secondary | ICD-10-CM | POA: Diagnosis not present

## 2022-03-12 DIAGNOSIS — I482 Chronic atrial fibrillation, unspecified: Secondary | ICD-10-CM

## 2022-03-12 DIAGNOSIS — I714 Abdominal aortic aneurysm, without rupture, unspecified: Secondary | ICD-10-CM | POA: Diagnosis not present

## 2022-03-12 NOTE — Assessment & Plan Note (Signed)
lipid control important in reducing the progression of atherosclerotic disease. Continue statin therapy  

## 2022-03-12 NOTE — Assessment & Plan Note (Signed)
CT scan of the abdomen pelvis which I have independently viewed.  This demonstrated significant growth of his abdominal aortic aneurysm sac now measuring 5.9 cm in maximal diameter.  There appears to be a fairly sizable type II endoleak present.  No obvious type I or III endoleak is seen. ?Given this finding, with significant aortic sac growth even with a type II endoleak this needs to be addressed.  I have recommended an angiogram with possible embolization of feeding branches that are creating the type II endoleak.  I have discussed the risks and benefits of the procedure.  I discussed this is generally an outpatient procedure.  I discussed that other branches can form and this may be something we have to do on multiple occasions.  Anecdotally, I have noticed these to be much more likely to cause growth on patients who are anticoagulated much as he is.  Patient and his family voiced their understanding and are agreeable to proceed. ?

## 2022-03-12 NOTE — Assessment & Plan Note (Signed)
On anticoagulation which seems to make type II endoleaks more likely ?

## 2022-03-12 NOTE — Progress Notes (Signed)
? ? ?MRN : [id number] ? ?Richard Wilkins is a 87 y.o. (05/24/1935) male who presents with chief complaint of  ?Chief Complaint  ?Patient presents with  ? Follow-up  ?  Hospital follow up  ?. ? ?History of Present Illness: Patient returns today in follow up of his aneurysm.  We are about 6 years status post endovascular repair of his abdominal aortic aneurysm.  He was in the hospital recently for other issues, and had a CT scan of the abdomen pelvis which I have independently viewed.  This demonstrated significant growth of his abdominal aortic aneurysm sac now measuring 5.9 cm in maximal diameter.  There appears to be a fairly sizable type II endoleak present.  No obvious type I or III endoleak is seen. ? ?Current Outpatient Medications  ?Medication Sig Dispense Refill  ? acetaminophen (TYLENOL) 500 MG tablet Take 500-1,000 mg by mouth every 6 (six) hours as needed (for pain.).     ? alfuzosin (UROXATRAL) 10 MG 24 hr tablet Take 1 tablet (10 mg total) by mouth daily with breakfast.    ? aspirin EC 81 MG tablet Take 81 mg by mouth daily.    ? diltiazem (CARDIZEM CD) 180 MG 24 hr capsule TAKE ONE CAPSULE BY MOUTH ONCE DAILY    ? diphenhydramine-acetaminophen (TYLENOL PM) 25-500 MG TABS tablet Take 1 tablet by mouth at bedtime as needed.    ? fluticasone (FLONASE) 50 MCG/ACT nasal spray Place 1 spray into both nostrils daily as needed (for allergies.).     ? guaiFENesin-dextromethorphan (ROBITUSSIN DM) 100-10 MG/5ML syrup Take 5 mLs by mouth at bedtime as needed for cough.    ? HYDROcodone-acetaminophen (NORCO/VICODIN) 5-325 MG tablet Take 1 tablet by mouth every 6 (six) hours as needed (for lower back pain.). 20 tablet 0  ? ketoconazole (NIZORAL) 2 % cream Apply topically 2 (two) times daily as needed.    ? meclizine (ANTIVERT) 12.5 MG tablet TAKE 1 TO 2 TABLETS BY MOUTH THREE TIMES DAILY AS NEEDED FOR MOTION SICKNESS    ? Misc Natural Products (COLON CLEANSE PO) Take by mouth.    ? simvastatin (ZOCOR) 40 MG tablet TAKE  ONE TABLET BY MOUTH AT BEDTIME    ? warfarin (COUMADIN) 5 MG tablet Take 5 mg by mouth at bedtime.     ? PFIZER COVID-19 VAC BIVALENT injection  (Patient not taking: Reported on 03/12/2022)    ? ?No current facility-administered medications for this visit.  ? ? ?Past Medical History:  ?Diagnosis Date  ? Abdominal aortic aneurysm (AAA) ([place])   ? Arthritis   ? Atrial fibrillation ([place])   ? Cancer [facility])   ? Chronic back pain   ? Colon polyp   ? Constipation   ? Coronary artery disease   ? Elevated lipids   ? Hemorrhage   ? Hyperlipidemia   ? Hypertension   ? Localized swelling of both lower legs   ? Myocardial infarction [facility])   ? 2004 x 2 stents  ? Sleep apnea   ? CPAP  ? ? ?Past Surgical History:  ?Procedure Laterality Date  ? CARDIAC SURGERY    ? CATARACT EXTRACTION W/PHACO Right 12/27/2019  ? Procedure: CATARACT EXTRACTION PHACO AND INTRAOCULAR LENS PLACEMENT ([place]) RIGHT;  Surgeon: Eulogio Bear, MD;  Location: [place];  Service: Ophthalmology;  Laterality: Right;  CDE 9.84 ?[place] 1:03.2  ? HEMORRHOID SURGERY    ? IRRIGATION AND DEBRIDEMENT HEMATOMA    ? PERIPHERAL VASCULAR CATHETERIZATION N/A 09/25/2016  ?  Procedure: Endovascular Repair/Stent Graft;  Surgeon: Algernon Huxley, MD;  Location: [place] [facility];  Service: Cardiovascular;  Laterality: N/A;  ? ? ? ?Social History  ? ?Tobacco Use  ? Smoking status: Former  ?  Types: Cigarettes  ?  Quit date: 11/11/2002  ?  Years since quitting: 19.3  ? Smokeless tobacco: Never  ?Vaping Use  ? Vaping Use: Never used  ?Substance Use Topics  ? Alcohol use: No  ? Drug use: No  ? ?  ? ? ?Family History  ?Problem Relation Age of Onset  ? Cancer - Other Brother   ? Prostate cancer Neg Hx   ? Bladder Cancer Neg Hx   ? Kidney cancer Neg Hx   ? ? ? ?No Known Allergies ? ? ?REVIEW OF SYSTEMS (Negative unless checked) ? ?Constitutional: '[]'$ Weight loss  '[]'$ Fever  '[]'$ Chills ?Cardiac: '[]'$ Chest pain   '[]'$ Chest pressure   '[x]'$ Palpitations   '[]'$ Shortness of breath when laying flat    '[]'$ Shortness of breath at rest   '[x]'$ Shortness of breath with exertion. ?Vascular:  '[]'$ Pain in legs with walking   '[]'$ Pain in legs at rest   '[]'$ Pain in legs when laying flat   '[]'$ Claudication   '[]'$ Pain in feet when walking  '[]'$ Pain in feet at rest  '[]'$ Pain in feet when laying flat   '[]'$ History of DVT   '[]'$ Phlebitis   '[x]'$ Swelling in legs   '[]'$ Varicose veins   '[]'$ Non-healing ulcers ?Pulmonary:   '[]'$ Uses home oxygen   '[]'$ Productive cough   '[]'$ Hemoptysis   '[]'$ Wheeze  '[]'$ COPD   '[]'$ Asthma ?Neurologic:  '[]'$ Dizziness  '[]'$ Blackouts   '[]'$ Seizures   '[]'$ History of stroke   '[]'$ History of TIA  '[]'$ Aphasia   '[]'$ Temporary blindness   '[]'$ Dysphagia   '[]'$ Weakness or numbness in arms   '[]'$ Weakness or numbness in legs ?Musculoskeletal:  '[x]'$ Arthritis   '[]'$ Joint swelling   '[x]'$ Joint pain   '[]'$ Low back pain ?Hematologic:  '[]'$ Easy bruising  '[]'$ Easy bleeding   '[]'$ Hypercoagulable state   '[]'$ Anemic   ?Gastrointestinal:  '[]'$ Blood in stool   '[]'$ Vomiting blood  '[x]'$ Gastroesophageal reflux/heartburn   '[]'$ Abdominal pain ?Genitourinary:  '[]'$ Chronic kidney disease   '[]'$ Difficult urination  '[]'$ Frequent urination  '[]'$ Burning with urination   '[]'$ Hematuria ?Skin:  '[]'$ Rashes   '[]'$ Ulcers   '[]'$ Wounds ?Psychological:  '[]'$ History of anxiety   '[]'$  History of major depression. ? ?Physical Examination ? ?BP 140/82 (BP Location: Left Arm)   Pulse 70   Resp 17   Ht '5\' 10"'$  (1.778 m)   Wt 275 lb (124.7 kg)   BMI 39.46 kg/m?  ?Gen:  WD/WN, NAD ?Head: [place]/AT, No temporalis wasting. ?Ear/Nose/Throat: Hearing grossly intact, nares w/o erythema or drainage ?Eyes: Conjunctiva clear. Sclera non-icteric ?Neck: Supple.  Trachea midline ?Pulmonary:  Good air movement, no use of accessory muscles.  ?Cardiac: Irregular ?Vascular:  ?Vessel Right Left  ?Radial Palpable Palpable  ?    ?    ?    ?    ?    ?    ?    ?    ? ?Gastrointestinal: soft, non-tender/non-distended. No guarding/reflex.  Aorta not easily palpable due to body habitus ?Musculoskeletal: M/S 5/5 throughout.  No deformity or atrophy.  Mild bilateral lower  extremity edema. ?Neurologic: Sensation grossly intact in extremities.  Symmetrical.  Speech is fluent.  ?Psychiatric: Judgment intact, Mood & affect appropriate for pt's clinical situation. ?Dermatologic: No rashes or ulcers noted.  No cellulitis or open wounds. ? ? ? ? ? ?Labs ?Recent Results (from the past 2160 hour(s))  ?I-STAT  creatinine     Status: Abnormal  ? Collection Time: 03/08/22  9:30 AM  ?Result Value Ref Range  ? Creatinine, Ser 0.60 (L) 0.61 - 1.24 mg/dL  ? ? ?Radiology ?VAS [place] EVAR DUPLEX ? ?Result Date: 02/27/2022 ?Endovascular Aortic Repair Study (EVAR) Patient Name:  REI MEDLEN  Date of Exam:   02/22/2022 Medical Rec #: [id number]       Accession #:    [id number] Date of Birth: 1935-03-12       Patient Gender: M Patient Age:   68 years Exam Location:  [facility] Procedure:      VAS [place] EVAR DUPLEX Referring Phys: Eulogio Ditch --------------------------------------------------------------------------------  Indications: Follow up exam for EVAR. Limitations: Air/bowel gas and obesity.  Performing Technologist: Blondell Reveal RT, RDMS, RVT  Examination Guidelines: A complete evaluation includes B-mode imaging, spectral Doppler, color Doppler, and power Doppler as needed of all accessible portions of each vessel. Bilateral testing is considered an integral part of a complete examination. Limited examinations for reoccurring indications may be performed as noted.  Endovascular Aortic Repair (EVAR): +---------+---------------+------------------+------------------+--------------+          Diameter AP    Diameter Trans    Velocities        Comments                (cm)           (cm)              (cm/sec)                         +---------+---------------+------------------+------------------+--------------+ Aorta    5.60           5.50              69                               +---------+---------------+------------------+------------------+--------------+ Right CIA                                                    not visualized +---------+---------------+------------------+------------------+--------------+ Left CIA                                                    not visualized +---------+-

## 2022-03-12 NOTE — Assessment & Plan Note (Signed)
blood pressure control important in reducing the progression of atherosclerotic disease and aneurysmal growth. On appropriate oral medications.  

## 2022-03-13 ENCOUNTER — Telehealth (INDEPENDENT_AMBULATORY_CARE_PROVIDER_SITE_OTHER): Payer: Self-pay

## 2022-03-13 NOTE — Telephone Encounter (Signed)
Spoke with the patient and he is scheduled with Dr. Lucky Cowboy for a Endoleak angio on 03/28/22 with a 6:45 am arrival time to the MM. Pre-procedure instructions were discussed and will be mailed. Patient was offered 03/25/22 and declined. ?

## 2022-03-26 NOTE — Telephone Encounter (Signed)
Patient called and stated he needed to reschedule his Endoleak angio with Dr. Lucky Cowboy out for two weeks due to an emergency at home. Patient has been rescheduled to 04/11/22 with Dr. Lucky Cowboy with a 6:45 am arrival time to the MM. Pre-procedure instructions will be mailed. ?

## 2022-03-28 DIAGNOSIS — I714 Abdominal aortic aneurysm, without rupture, unspecified: Secondary | ICD-10-CM

## 2022-04-01 DIAGNOSIS — I482 Chronic atrial fibrillation, unspecified: Secondary | ICD-10-CM | POA: Diagnosis not present

## 2022-04-04 ENCOUNTER — Telehealth (INDEPENDENT_AMBULATORY_CARE_PROVIDER_SITE_OTHER): Payer: Self-pay

## 2022-04-04 NOTE — Telephone Encounter (Signed)
Patient called to reschedule his procedure with Dr. Lucky Cowboy from 04/11/22 to 04/18/22. Patient has been rescheduled to 04/18/22 with a 10:00 am arrival time to the MM. Patient was advised that the information from the two previous pre-procedure letters are the same other than the date and time of arrival.

## 2022-04-05 DIAGNOSIS — G4733 Obstructive sleep apnea (adult) (pediatric): Secondary | ICD-10-CM | POA: Diagnosis not present

## 2022-04-11 DIAGNOSIS — I714 Abdominal aortic aneurysm, without rupture, unspecified: Secondary | ICD-10-CM

## 2022-04-18 ENCOUNTER — Ambulatory Visit
Admission: RE | Admit: 2022-04-18 | Discharge: 2022-04-18 | Disposition: A | Discharge status: Home / Self Care | Payer: PPO | Attending: Vascular Surgery | Admitting: Vascular Surgery

## 2022-04-18 ENCOUNTER — Other Ambulatory Visit: Payer: Self-pay

## 2022-04-18 ENCOUNTER — Encounter
Admission: RE | Disposition: A | Discharge status: Home / Self Care | Payer: Self-pay | Source: Home / Self Care | Attending: Vascular Surgery

## 2022-04-18 ENCOUNTER — Encounter: Payer: Self-pay | Admitting: Certified Registered Nurse Anesthetist

## 2022-04-18 ENCOUNTER — Encounter: Payer: Self-pay | Admitting: Vascular Surgery

## 2022-04-18 DIAGNOSIS — Z7901 Long term (current) use of anticoagulants: Secondary | ICD-10-CM | POA: Diagnosis not present

## 2022-04-18 DIAGNOSIS — I482 Chronic atrial fibrillation, unspecified: Secondary | ICD-10-CM | POA: Diagnosis not present

## 2022-04-18 DIAGNOSIS — E785 Hyperlipidemia, unspecified: Secondary | ICD-10-CM | POA: Diagnosis not present

## 2022-04-18 DIAGNOSIS — Y832 Surgical operation with anastomosis, bypass or graft as the cause of abnormal reaction of the patient, or of later complication, without mention of misadventure at the time of the procedure: Secondary | ICD-10-CM | POA: Insufficient documentation

## 2022-04-18 DIAGNOSIS — I714 Abdominal aortic aneurysm, without rupture, unspecified: Secondary | ICD-10-CM | POA: Insufficient documentation

## 2022-04-18 DIAGNOSIS — I10 Essential (primary) hypertension: Secondary | ICD-10-CM | POA: Insufficient documentation

## 2022-04-18 DIAGNOSIS — T82330A Leakage of aortic (bifurcation) graft (replacement), initial encounter: Secondary | ICD-10-CM

## 2022-04-18 DIAGNOSIS — Z87891 Personal history of nicotine dependence: Secondary | ICD-10-CM | POA: Diagnosis not present

## 2022-04-18 DIAGNOSIS — I713 Abdominal aortic aneurysm, ruptured, unspecified: Secondary | ICD-10-CM

## 2022-04-18 HISTORY — PX: LOWER EXTREMITY ANGIOGRAPHY: CATH118251

## 2022-04-18 LAB — PROTIME-INR
INR: 1.5 — ABNORMAL HIGH (ref 0.8–1.2)
Prothrombin Time: 18.1 seconds — ABNORMAL HIGH (ref 11.4–15.2)

## 2022-04-18 LAB — BUN: BUN: 13 mg/dL (ref 8–23)

## 2022-04-18 LAB — CREATININE, SERUM
Creatinine, Ser: 0.46 mg/dL — ABNORMAL LOW (ref 0.61–1.24)
GFR, Estimated: >60 mL/min (ref >60)

## 2022-04-18 SURGERY — LOWER EXTREMITY ANGIOGRAPHY
Anesthesia: Moderate Sedation

## 2022-04-18 MED ORDER — HEPARIN SODIUM (PORCINE) 1000 UNIT/ML IJ SOLN
INTRAMUSCULAR | Status: AC
  Filled 2022-04-18: qty 10

## 2022-04-18 MED ORDER — METHYLPREDNISOLONE SODIUM SUCC 125 MG IJ SOLR: 125.0000 mg | Freq: Once | INTRAMUSCULAR | Status: DC | PRN

## 2022-04-18 MED ORDER — CEFAZOLIN SODIUM-DEXTROSE 2-4 GM/100ML-% IV SOLN
INTRAVENOUS | Status: AC
  Administered 2022-04-18: 2 g via INTRAVENOUS
  Filled 2022-04-18: qty 100

## 2022-04-18 MED ORDER — ONDANSETRON HCL 4 MG/2ML IJ SOLN
4.0000 mg | Freq: Four times a day (QID) | INTRAMUSCULAR | Status: DC | PRN
Start: 2022-04-18 — End: 2022-05-06

## 2022-04-18 MED ORDER — FENTANYL CITRATE (PF) 100 MCG/2ML IJ SOLN
INTRAMUSCULAR | Status: AC
  Filled 2022-04-18: qty 2

## 2022-04-18 MED ORDER — SODIUM CHLORIDE 0.9 % IV SOLN: INTRAVENOUS | Status: DC

## 2022-04-18 MED ORDER — FAMOTIDINE 20 MG PO TABS: 40.0000 mg | ORAL_TABLET | Freq: Once | ORAL | Status: DC | PRN

## 2022-04-18 MED ORDER — HYDROMORPHONE HCL 1 MG/ML IJ SOLN: 1.0000 mg | Freq: Once | INTRAMUSCULAR | Status: DC | PRN

## 2022-04-18 MED ORDER — FENTANYL CITRATE (PF) 100 MCG/2ML IJ SOLN
INTRAMUSCULAR | Status: DC | PRN
  Administered 2022-04-18: 50 ug via INTRAVENOUS

## 2022-04-18 MED ORDER — MIDAZOLAM HCL 2 MG/ML PO SYRP: 8.0000 mg | ORAL_SOLUTION | Freq: Once | ORAL | Status: DC | PRN

## 2022-04-18 MED ORDER — MIDAZOLAM HCL 2 MG/2ML IJ SOLN
INTRAMUSCULAR | Status: AC
  Filled 2022-04-18: qty 2

## 2022-04-18 MED ORDER — HEPARIN SODIUM (PORCINE) 1000 UNIT/ML IJ SOLN
INTRAMUSCULAR | Status: DC | PRN
  Administered 2022-04-18: 3000 [IU] via INTRAVENOUS

## 2022-04-18 MED ORDER — DIPHENHYDRAMINE HCL 50 MG/ML IJ SOLN: 50.0000 mg | Freq: Once | INTRAMUSCULAR | Status: DC | PRN

## 2022-04-18 MED ORDER — CEFAZOLIN SODIUM-DEXTROSE 2-4 GM/100ML-% IV SOLN: 2.0000 g | INTRAVENOUS | Status: AC

## 2022-04-18 MED ORDER — IODIXANOL 320 MG/ML IV SOLN
INTRAVENOUS | Status: DC | PRN
  Administered 2022-04-18: 35 mL via INTRA_ARTERIAL

## 2022-04-18 MED ORDER — MIDAZOLAM HCL 2 MG/2ML IJ SOLN
INTRAMUSCULAR | Status: DC | PRN
  Administered 2022-04-18: 1 mg via INTRAVENOUS

## 2022-04-18 SURGICAL SUPPLY — 18 items
CATH ANGIO 5F PIGTAIL 65CM (CATHETERS) ×2
CATH VS15FR (CATHETERS) ×2
COIL 400 COMPLEX SOFT 2X2CM (Vascular Products) ×2 IMPLANT
COIL 400 COMPLEX SOFT 2X4CM (Vascular Products) ×4 IMPLANT
COIL 400 COMPLEX SOFT 3X15CM (Vascular Products) ×4 IMPLANT
COIL 400 COMPLEX SOFT 3X5CM (Vascular Products) ×2 IMPLANT
COIL 400 COMPLEX SOFT 4X6CM (Vascular Products) ×4 IMPLANT
COVER PROBE U/S 5X48 (MISCELLANEOUS) ×2
DEVICE STARCLOSE SE CLOSURE (Vascular Products) ×2 IMPLANT
DEVICE TORQUE .025-.038 (MISCELLANEOUS) ×2
GLIDEWIRE STIFF .35X180X3 HYDR (WIRE) ×2
HANDLE DETACHMENT COIL (MISCELLANEOUS) ×2
MICROCATH PROGREAT 2.8F 130CM (MICROCATHETER) ×2
PACK ANGIOGRAPHY (CUSTOM PROCEDURE TRAY) ×2
SHEATH BRITE TIP 5FRX11 (SHEATH) ×2
SYR MEDRAD MARK 7 150ML (SYRINGE) ×2
TUBING CONTRAST HIGH PRESS 72 (TUBING) ×2
WIRE GUIDERIGHT .035X150 (WIRE) ×1 IMPLANT

## 2022-04-18 NOTE — H&P (Signed)
[facility] Admission History & Physical  MRN : [id number]  Richard Wilkins is a 87 y.o. (06-23-1935) male who presents with chief complaint of No chief complaint on file. [name] Kitchen  History of Present Illness:   History of Present Illness: Patient returns today in follow up of his aneurysm.  We are about 6 years status post endovascular repair of his abdominal aortic aneurysm.  He was in the hospital recently for other issues, and had a CT scan of the abdomen pelvis which I have independently viewed.  This demonstrated significant growth of his abdominal aortic aneurysm sac now measuring 5.9 cm in maximal diameter.  There appears to be a fairly sizable type II endoleak present.  No obvious type I or III endoleak is seen.  Current Facility-Administered Medications  Medication Dose Route Frequency Provider Last Rate Last Admin   0.9 %  sodium chloride infusion   Intravenous Continuous Kris Hartmann, NP 75 mL/hr at 04/18/22 1008 New Bag at 04/18/22 1008   ceFAZolin (ANCEF) 2-4 GM/100ML-% IVPB            ceFAZolin (ANCEF) IVPB 2g/100 mL premix  2 g Intravenous 30 min Pre-Op Kris Hartmann, NP       diphenhydrAMINE (BENADRYL) injection 50 mg  50 mg Intravenous Once PRN Kris Hartmann, NP       famotidine (PEPCID) tablet 40 mg  40 mg Oral Once PRN Kris Hartmann, NP       HYDROmorphone (DILAUDID) injection 1 mg  1 mg Intravenous Once PRN Kris Hartmann, NP       methylPREDNISolone sodium succinate (SOLU-MEDROL) 125 mg/2 mL injection 125 mg  125 mg Intravenous Once PRN Kris Hartmann, NP       midazolam (VERSED) 2 MG/ML syrup 8 mg  8 mg Oral Once PRN Kris Hartmann, NP       ondansetron (ZOFRAN) injection 4 mg  4 mg Intravenous Q6H PRN Kris Hartmann, NP        Past Medical History:  Diagnosis Date   Abdominal aortic aneurysm (AAA) ([place])    Arthritis    Atrial fibrillation ([place])    Cancer ([place])    Chronic back pain    Colon polyp    Constipation    Coronary artery  disease    Elevated lipids    Hemorrhage    Hyperlipidemia    Hypertension    Localized swelling of both lower legs    Myocardial infarction ([place])    2004 x 2 stents   Sleep apnea    CPAP    Past Surgical History:  Procedure Laterality Date   CARDIAC SURGERY     CATARACT EXTRACTION W/PHACO Right 12/27/2019   Procedure: CATARACT EXTRACTION PHACO AND INTRAOCULAR LENS PLACEMENT ([facility]) RIGHT;  Surgeon: Eulogio Bear, MD;  Location: [place];  Service: Ophthalmology;  Laterality: Right;  CDE 9.84 [place] 1:03.2   HEMORRHOID SURGERY     IRRIGATION AND DEBRIDEMENT HEMATOMA     PERIPHERAL VASCULAR CATHETERIZATION N/A 09/25/2016   Procedure: Endovascular Repair/Stent Graft;  Surgeon: Algernon Huxley, MD;  Location: [place] [facility];  Service: Cardiovascular;  Laterality: N/A;     Social History   Tobacco Use   Smoking status: Former    Types: Cigarettes    Quit date: 11/11/2002    Years since quitting: 19.4   Smokeless tobacco: Never  Vaping Use   Vaping Use: Never used  Substance Use Topics  Alcohol use: No   Drug use: No     Family History  Problem Relation Age of Onset   Cancer - Other Brother    Prostate cancer Neg Hx    Bladder Cancer Neg Hx    Kidney cancer Neg Hx     No Known Allergies   REVIEW OF SYSTEMS (Negative unless checked)  Constitutional: '[]'$ Weight loss  '[]'$ Fever  '[]'$ Chills Cardiac: '[]'$ Chest pain   '[]'$ Chest pressure   '[]'$ Palpitations   '[]'$ Shortness of breath when laying flat   '[]'$ Shortness of breath at rest   '[]'$ Shortness of breath with exertion. Vascular:  '[]'$ Pain in legs with walking   '[]'$ Pain in legs at rest   '[]'$ Pain in legs when laying flat   '[]'$ Claudication   '[]'$ Pain in feet when walking  '[]'$ Pain in feet at rest  '[]'$ Pain in feet when laying flat   '[]'$ History of DVT   '[]'$ Phlebitis   '[x]'$ Swelling in legs   '[]'$ Varicose veins   '[]'$ Non-healing ulcers Pulmonary:   '[]'$ Uses home oxygen   '[]'$ Productive cough   '[]'$ Hemoptysis   '[]'$ Wheeze  '[]'$ COPD   '[]'$ Asthma Neurologic:   '[]'$ Dizziness  '[]'$ Blackouts   '[]'$ Seizures   '[]'$ History of stroke   '[]'$ History of TIA  '[]'$ Aphasia   '[]'$ Temporary blindness   '[]'$ Dysphagia   '[]'$ Weakness or numbness in arms   '[]'$ Weakness or numbness in legs Musculoskeletal:  '[x]'$ Arthritis   '[]'$ Joint swelling   '[]'$ Joint pain   '[x]'$ Low back pain Hematologic:  '[]'$ Easy bruising  '[]'$ Easy bleeding   '[]'$ Hypercoagulable state   '[]'$ Anemic  '[]'$ Hepatitis Gastrointestinal:  '[]'$ Blood in stool   '[]'$ Vomiting blood  '[]'$ Gastroesophageal reflux/heartburn   '[]'$ Difficulty swallowing. Genitourinary:  '[]'$ Chronic kidney disease   '[]'$ Difficult urination  '[]'$ Frequent urination  '[]'$ Burning with urination   '[]'$ Blood in urine Skin:  '[]'$ Rashes   '[]'$ Ulcers   '[]'$ Wounds Psychological:  '[]'$ History of anxiety   '[]'$  History of major depression.  Physical Examination  Vitals:   04/18/22 1004  BP: (!) 169/90  Pulse: 89  Temp: 97.7 F (36.5 C)  TempSrc: Oral  SpO2: 94%  Weight: 122.5 kg  Height: '5\' 10"'$  (1.778 m)   Body mass index is 38.74 kg/m. Gen: WD/WN, NAD. obese Head: [place]/AT, No temporalis wasting.  Ear/Nose/Throat: Hearing grossly intact, nares w/o erythema or drainage, oropharynx w/o Erythema/Exudate,  Eyes: Conjunctiva clear, sclera non-icteric Neck: Trachea midline.  No JVD.  Pulmonary:  Good air movement, respirations not labored, no use of accessory muscles.  Cardiac: RRR, normal S1, S2. Vascular:  Vessel Right Left  Radial Palpable Palpable       Musculoskeletal: M/S 5/5 throughout.  Extremities without ischemic changes.  No deformity or atrophy.  Neurologic: Sensation grossly intact in extremities.  Symmetrical.  Speech is fluent. Motor exam as listed above. Psychiatric: Judgment intact, Mood & affect appropriate for pt's clinical situation. Dermatologic: No rashes or ulcers noted.  No cellulitis or open wounds.      CBC Lab Results  Component Value Date   WBC 13.9 (H) 09/26/2016   HGB 13.8 09/26/2016   HCT 40.2 09/26/2016   MCV 97.2 09/26/2016   PLT 217 09/26/2016     BMET    Component Value Date/Time   NA 137 09/26/2016 0315   NA 140 10/21/2014 0535   K 4.0 09/26/2016 0315   K 3.7 10/21/2014 0535   CL 105 09/26/2016 0315   CL 107 10/21/2014 0535   CO2 27 09/26/2016 0315   CO2 28 10/21/2014 0535   GLUCOSE 134 (H) 09/26/2016 0315   GLUCOSE 101 (H)  10/21/2014 0535   BUN 13 04/18/2022 1007   BUN 13 10/21/2014 0535   CREATININE 0.46 (L) 04/18/2022 1007   CREATININE 0.64 10/21/2014 0535   CALCIUM 8.9 09/26/2016 0315   CALCIUM 8.3 (L) 10/21/2014 0535   GFRNONAA >60 04/18/2022 1007   GFRNONAA >60 10/21/2014 0535   GFRAA >60 09/26/2016 0315   GFRAA >60 10/21/2014 0535   Estimated Creatinine Clearance: 90.2 mL/min (A) (by C-G formula based on SCr of 0.46 mg/dL (L)).  COAG Lab Results  Component Value Date   INR 1.5 (H) 04/18/2022   INR 1.21 09/26/2016   INR 1.31 09/25/2016    Radiology No results found.   Assessment/Plan Atrial fibrillation, chronic (HCC) On anticoagulation which seems to make type II endoleaks more likely   Hypertension blood pressure control important in reducing the progression of atherosclerotic disease and aneurysmal growth. On appropriate oral medications.     Hyperlipidemia lipid control important in reducing the progression of atherosclerotic disease. Continue statin therapy     AAA (abdominal aortic aneurysm) without rupture (HCC) CT scan of the abdomen pelvis which I have independently viewed.  This demonstrated significant growth of his abdominal aortic aneurysm sac now measuring 5.9 cm in maximal diameter.  There appears to be a fairly sizable type II endoleak present.  No obvious type I or III endoleak is seen. Given this finding, with significant aortic sac growth even with a type II endoleak this needs to be addressed.  I have recommended an angiogram with possible embolization of feeding branches that are creating the type II endoleak.  I have discussed the risks and benefits of the procedure.  I  discussed this is generally an outpatient procedure.  I discussed that other branches can form and this may be something we have to do on multiple occasions.  Anecdotally, I have noticed these to be much more likely to cause growth on patients who are anticoagulated much as he is.  Patient and his family voiced their understanding and are agreeable to proceed.  Leotis Pain, MD  04/18/2022 4:00 PM

## 2022-04-18 NOTE — Op Note (Signed)
[facility]  Percutaneous Study/Intervention Procedural Note   Date of Surgery: 04/18/2022  Surgeon(s): Leotis Pain   Assistants:none  Pre-operative Diagnosis: Abdominal aortic aneurysm, type II endoleak with sac growth status post AAA repair  Post-operative diagnosis:  Same  Procedure(s) Performed:             1.  Ultrasound guidance for vascular access right femoral artery             2.  Catheter placement into a tertiary right hypogastric artery branches x2             3.  Aortogram and selective hypogastric artery angiogram             4.  Coil embolization of a total of 4 right hypogastric artery branches with a series of Ruby coils for branches feeding the aneurysm sac             5.  StarClose closure device right femoral artery  EBL: 10 cc  Contrast: 35 cc  Fluoro Time: 9.7 min  Moderate Conscious Sedation time: approximately 37 minutes using 1 mg of Versed and 50 mcg of Fentanyl              Indications:  Patient is a 87 y.o. male with type II endoleak status post endovascular abdominal aortic aneurysm repair in the past. The patient has noninvasive study showing aortic sac growth. The patient is brought in for angiography for further evaluation and potential treatment. Risks and benefits are discussed and informed consent is obtained  Procedure:  The patient was identified and appropriate procedural time out was performed.  The patient was then placed supine on the table and prepped and draped in the usual sterile fashion. Moderate conscious sedation was administered throughout the procedure with a face to face encounter with the patient throughout and with my supervision of the RN administering medicines and monitoring the patients vital signs and mental status throughout from the start of the procedure until the patient was taken to the recovery room.   Ultrasound was used to evaluate the right common femoral artery.  It was patent .  A digital  ultrasound image was acquired.  A Seldinger needle was used to access the right common femoral artery under direct ultrasound guidance and a permanent image was performed.  The stent graft was widely patent.  There was good seal proximally and distally and no type I or III endoleak's were identified.  The right hypogastric artery had to main branches 1 of each had 3 secondary branches that were feeding the aneurysm sac relatively briskly.  The left hypogastric artery had 2 smaller branches that were feeding the aneurysm sac although much less briskly.  No branches off of the SMA were obviously filling through IMA collaterals, but this may have been due to catheter position or timing.  The renal arteries were widely patent and the stent graft was in excellent location.  I elected to coil embolize the branches of the right hypogastric artery if technically feasible that were feeding the aneurysm sac and appeared to be the nidus for the type II endoleak.  Using a V S1 catheter was able to selectively cannulate the right hypogastric artery and selective imaging did show 2 separate branches 1 of which was more medial and was small but was feeding the sac and the more lateral of the 2 branches which was the much harder to cannulate was much larger and actually had 3 separate  branches that were then feeding the aneurysm sac.  Tediously, using the prograde microcatheter I first cannulated the more medial of these 2 branches was able to get well out this branch feeding the aneurysm sac.  In this location, I placed a 2 mm diameter by 2 cm soft and then a pair of 2 mm diameter by 4 cm length soft coils which successfully knocked out the flow in this branch.  Then, even more tediously I cannulated the more lateral of the 2 branches and first got in the most medial of its 3 branches and began coil embolization.  In this location, I put a pair of 3 mm diameter by 15 cm soft coils.  I then was able to push the last portion of the  second coil from above into the middle of the 3 branches and then add a 3 mm diameter by 5 cm soft coil to this branch.  Finally, I cannulated the most lateral branch and a 4 mm diameter by 6 cm length soft coil was placed in this branch and then an additional 4 mm diameter by 6 cm length soft coil was placed in this more main trunk before the 3 branches but beyond the initial hypogastric artery branch.  Completion imaging showed successful coil embolization of the entirety of the branches that were feeding the aneurysm sac from the right hypogastric artery with no filling of the aneurysm sac at this point.  I did not feel it was necessary to go after the small vessels feeding the left side hypogastric artery and and less he has sac growth continued and we could do this in several months.  I elected to terminate the procedure. The sheath was removed and StarClose closure device was deployed in the left femoral artery with excellent hemostatic result. The patient was taken to the recovery room in stable condition having tolerated the procedure well.  Findings:               Aortogram:  The stent graft was widely patent.  There was good seal proximally and distally and no type I or III endoleak's were identified.  The right hypogastric artery had to main branches 1 of each had 3 secondary branches that were feeding the aneurysm sac relatively briskly.  The left hypogastric artery had 2 smaller branches that were feeding the aneurysm sac although much less briskly.  No branches off of the SMA were obviously filling through IMA collaterals, but this may have been due to catheter position or timing.  The renal arteries were widely patent and the stent graft was in excellent location.                Disposition: Patient was taken to the recovery room in stable condition having tolerated the procedure well.  Complications: None  Leotis Pain 04/18/2022 5:26 PM     This note was created with Dragon Medical  transcription system. Any errors in dictation are purely unintentional.

## 2022-04-19 ENCOUNTER — Encounter: Payer: Self-pay | Admitting: Vascular Surgery

## 2022-04-25 DIAGNOSIS — E785 Hyperlipidemia, unspecified: Secondary | ICD-10-CM | POA: Diagnosis not present

## 2022-04-25 DIAGNOSIS — I714 Abdominal aortic aneurysm, without rupture, unspecified: Secondary | ICD-10-CM | POA: Diagnosis not present

## 2022-04-25 DIAGNOSIS — I10 Essential (primary) hypertension: Secondary | ICD-10-CM | POA: Diagnosis not present

## 2022-04-25 DIAGNOSIS — G4730 Sleep apnea, unspecified: Secondary | ICD-10-CM | POA: Diagnosis not present

## 2022-04-25 DIAGNOSIS — I482 Chronic atrial fibrillation, unspecified: Secondary | ICD-10-CM | POA: Diagnosis not present

## 2022-04-25 DIAGNOSIS — Z9889 Other specified postprocedural states: Secondary | ICD-10-CM | POA: Diagnosis not present

## 2022-04-25 DIAGNOSIS — I2510 Atherosclerotic heart disease of native coronary artery without angina pectoris: Secondary | ICD-10-CM | POA: Diagnosis not present

## 2022-04-25 DIAGNOSIS — R0602 Shortness of breath: Secondary | ICD-10-CM | POA: Diagnosis not present

## 2022-04-25 DIAGNOSIS — I739 Peripheral vascular disease, unspecified: Secondary | ICD-10-CM | POA: Diagnosis not present

## 2022-05-07 ENCOUNTER — Encounter: Payer: Self-pay | Admitting: Vascular Surgery

## 2022-05-13 DIAGNOSIS — E785 Hyperlipidemia, unspecified: Secondary | ICD-10-CM | POA: Diagnosis not present

## 2022-05-13 DIAGNOSIS — Z79891 Long term (current) use of opiate analgesic: Secondary | ICD-10-CM | POA: Diagnosis not present

## 2022-05-13 DIAGNOSIS — I10 Essential (primary) hypertension: Secondary | ICD-10-CM | POA: Diagnosis not present

## 2022-05-13 DIAGNOSIS — I482 Chronic atrial fibrillation, unspecified: Secondary | ICD-10-CM | POA: Diagnosis not present

## 2022-05-13 DIAGNOSIS — E538 Deficiency of other specified B group vitamins: Secondary | ICD-10-CM | POA: Diagnosis not present

## 2022-05-20 DIAGNOSIS — Z0000 Encounter for general adult medical examination without abnormal findings: Secondary | ICD-10-CM | POA: Diagnosis not present

## 2022-05-20 DIAGNOSIS — E538 Deficiency of other specified B group vitamins: Secondary | ICD-10-CM | POA: Diagnosis not present

## 2022-05-20 DIAGNOSIS — I714 Abdominal aortic aneurysm, without rupture, unspecified: Secondary | ICD-10-CM | POA: Diagnosis not present

## 2022-05-20 DIAGNOSIS — D692 Other nonthrombocytopenic purpura: Secondary | ICD-10-CM | POA: Diagnosis not present

## 2022-05-20 DIAGNOSIS — E785 Hyperlipidemia, unspecified: Secondary | ICD-10-CM | POA: Diagnosis not present

## 2022-05-20 DIAGNOSIS — Z79891 Long term (current) use of opiate analgesic: Secondary | ICD-10-CM | POA: Diagnosis not present

## 2022-05-20 DIAGNOSIS — I10 Essential (primary) hypertension: Secondary | ICD-10-CM | POA: Diagnosis not present

## 2022-05-20 DIAGNOSIS — I739 Peripheral vascular disease, unspecified: Secondary | ICD-10-CM | POA: Diagnosis not present

## 2022-05-20 DIAGNOSIS — I2510 Atherosclerotic heart disease of native coronary artery without angina pectoris: Secondary | ICD-10-CM | POA: Diagnosis not present

## 2022-05-20 DIAGNOSIS — I482 Chronic atrial fibrillation, unspecified: Secondary | ICD-10-CM | POA: Diagnosis not present

## 2022-05-20 DIAGNOSIS — D6869 Other thrombophilia: Secondary | ICD-10-CM | POA: Diagnosis not present

## 2022-06-04 ENCOUNTER — Other Ambulatory Visit (INDEPENDENT_AMBULATORY_CARE_PROVIDER_SITE_OTHER): Payer: Self-pay | Admitting: Vascular Surgery

## 2022-06-04 DIAGNOSIS — I482 Chronic atrial fibrillation, unspecified: Secondary | ICD-10-CM | POA: Diagnosis not present

## 2022-06-04 DIAGNOSIS — I9789 Other postprocedural complications and disorders of the circulatory system, not elsewhere classified: Secondary | ICD-10-CM

## 2022-06-04 DIAGNOSIS — Z79891 Long term (current) use of opiate analgesic: Secondary | ICD-10-CM | POA: Diagnosis not present

## 2022-06-05 ENCOUNTER — Ambulatory Visit (INDEPENDENT_AMBULATORY_CARE_PROVIDER_SITE_OTHER): Payer: PPO | Admitting: Nurse Practitioner

## 2022-06-05 ENCOUNTER — Encounter (INDEPENDENT_AMBULATORY_CARE_PROVIDER_SITE_OTHER): Payer: Self-pay | Admitting: Nurse Practitioner

## 2022-06-05 ENCOUNTER — Ambulatory Visit (INDEPENDENT_AMBULATORY_CARE_PROVIDER_SITE_OTHER): Payer: PPO

## 2022-06-05 VITALS — BP 115/79 | HR 55

## 2022-06-05 DIAGNOSIS — I9789 Other postprocedural complications and disorders of the circulatory system, not elsewhere classified: Secondary | ICD-10-CM

## 2022-06-05 DIAGNOSIS — I10 Essential (primary) hypertension: Secondary | ICD-10-CM

## 2022-06-05 DIAGNOSIS — E785 Hyperlipidemia, unspecified: Secondary | ICD-10-CM

## 2022-06-05 NOTE — Progress Notes (Signed)
Subjective:    Patient ID: Richard Wilkins, male    DOB: 1935/02/14, 87 y.o.   MRN: [id number] Chief Complaint  Patient presents with   Follow-up    Patient returns today in follow up of his aneurysm.  We are about 6 years status post endovascular repair of his abdominal aortic aneurysm.  At his routine follow-up the patient was noted to have growth in the abdominal aortic aneurysm sac size which was concerning for possible endoleak.  CT scan of the abdomen pelvis noted significant growth.  There was a fairly sizable type II endoleak present.  The patient underwent coil embolization on 04/18/2022.  He currently denies any pain or issues.  He notes that there was some soreness and bruising post intervention but today no significant issues.  Today the sac measures approximately 5.4 cm, the sac measured 4.6 cm prior to intervention.     Review of Systems  Cardiovascular:  Negative for leg swelling.  All other systems reviewed and are negative.      Objective:   Physical Exam Vitals reviewed.  HENT:     Head: Normocephalic.  Cardiovascular:     Rate and Rhythm: Normal rate.     Pulses: Normal pulses.  Pulmonary:     Effort: Pulmonary effort is normal.  Skin:    General: Skin is warm and dry.  Neurological:     Mental Status: He is alert and oriented to person, place, and time.  Psychiatric:        Mood and Affect: Mood normal.        Behavior: Behavior normal.        Thought Content: Thought content normal.        Judgment: Judgment normal.     BP 115/79   Pulse (!) 55   Past Medical History:  Diagnosis Date   Abdominal aortic aneurysm (AAA) (HCC)    Arthritis    Atrial fibrillation (HCC)    Cancer (HCC)    Chronic back pain    Colon polyp    Constipation    Coronary artery disease    Elevated lipids    Hemorrhage    Hyperlipidemia    Hypertension    Localized swelling of both lower legs    Myocardial infarction ([place])    2004 x 2 stents   Sleep apnea    CPAP     Social History   Socioeconomic History   Marital status: Married    Spouse name: Not on file   Number of children: Not on file   Years of education: Not on file   Highest education level: Not on file  Occupational History   Occupation: retired  Tobacco Use   Smoking status: Former    Types: Cigarettes    Quit date: 11/11/2002    Years since quitting: 19.5   Smokeless tobacco: Never  Vaping Use   Vaping Use: Never used  Substance and Sexual Activity   Alcohol use: No   Drug use: No   Sexual activity: Not on file  Other Topics Concern   Not on file  Social History Narrative   Not on file   Social Determinants of Health   Financial Resource Strain: Not on file  Food Insecurity: Not on file  Transportation Needs: Not on file  Physical Activity: Not on file  Stress: Not on file  Social Connections: Not on file  Intimate Partner Violence: Not on file    Past Surgical History:  Procedure Laterality  Date   CARDIAC SURGERY     CATARACT EXTRACTION W/PHACO Right 12/27/2019   Procedure: CATARACT EXTRACTION PHACO AND INTRAOCULAR LENS PLACEMENT ([place]) RIGHT;  Surgeon: Eulogio Bear, MD;  Location: [facility];  Service: Ophthalmology;  Laterality: Right;  CDE 9.84 [place] 1:03.2   HEMORRHOID SURGERY     IRRIGATION AND DEBRIDEMENT HEMATOMA     LOWER EXTREMITY ANGIOGRAPHY N/A 04/18/2022   Procedure: Lower Extremity Angiography;  Surgeon: Algernon Huxley, MD;  Location: [facility];  Service: Cardiovascular;  Laterality: N/A;   PERIPHERAL VASCULAR CATHETERIZATION N/A 09/25/2016   Procedure: Endovascular Repair/Stent Graft;  Surgeon: Algernon Huxley, MD;  Location: [facility];  Service: Cardiovascular;  Laterality: N/A;    Family History  Problem Relation Age of Onset   Cancer - Other Brother    Prostate cancer Neg Hx    Bladder Cancer Neg Hx    Kidney cancer Neg Hx     No Known Allergies     Latest Ref Rng & Units 09/26/2016    3:15 AM 09/25/2016     1:14 PM 08/09/2016    5:10 AM  CBC  WBC 3.8 - 10.6 K/uL 13.9  9.2  10.5   Hemoglobin 13.0 - 18.0 g/dL 13.8  14.5  15.1   Hematocrit 40.0 - 52.0 % 40.2  42.5  44.2   Platelets 150 - 440 K/uL 217  215  223       CMP     Component Value Date/Time   NA 137 09/26/2016 0315   NA 140 10/21/2014 0535   K 4.0 09/26/2016 0315   K 3.7 10/21/2014 0535   CL 105 09/26/2016 0315   CL 107 10/21/2014 0535   CO2 27 09/26/2016 0315   CO2 28 10/21/2014 0535   GLUCOSE 134 (H) 09/26/2016 0315   GLUCOSE 101 (H) 10/21/2014 0535   BUN 13 04/18/2022 1007   BUN 13 10/21/2014 0535   CREATININE 0.46 (L) 04/18/2022 1007   CREATININE 0.64 10/21/2014 0535   CALCIUM 8.9 09/26/2016 0315   CALCIUM 8.3 (L) 10/21/2014 0535   PROT 7.5 08/08/2016 2156   ALBUMIN 4.4 08/08/2016 2156   AST 21 08/08/2016 2156   ALT 20 08/08/2016 2156   ALKPHOS 41 08/08/2016 2156   BILITOT 1.4 (H) 08/08/2016 2156   GFRNONAA >60 04/18/2022 1007   GFRNONAA >60 10/21/2014 0535   GFRAA >60 09/26/2016 0315   GFRAA >60 10/21/2014 0535     No results found.     Assessment & Plan:   1. Type II endoleak of aortic graft Currently no evidence of endoleak on ultrasound.  The previously measured abdominal aortic aneurysm was 5.6 cm prior to intervention and today it measures 5.4 cm.  I discussed with the patient that typically we evaluate by looking for continued decrease in sac or at least no drastic growth.  Patient will continue antiplatelet therapy.  Patient is advised to follow-up in 3 months for reevaluation. - VAS [place] EVAR DUPLEX  2. Primary hypertension Continue antihypertensive medications as already ordered, these medications have been reviewed and there are no changes at this time.   3. Hyperlipidemia, unspecified hyperlipidemia type Continue statin as ordered and reviewed, no changes at this time    Current Outpatient Medications on File Prior to Visit  Medication Sig Dispense Refill   acetaminophen (TYLENOL) 500 MG  tablet Take 500-1,000 mg by mouth every 6 (six) hours as needed (for pain.).      alfuzosin (UROXATRAL)  10 MG 24 hr tablet Take 1 tablet (10 mg total) by mouth daily with breakfast.     aspirin EC 81 MG tablet Take 81 mg by mouth daily.     diltiazem (CARDIZEM CD) 180 MG 24 hr capsule TAKE ONE CAPSULE BY MOUTH ONCE DAILY     diphenhydramine-acetaminophen (TYLENOL PM) 25-500 MG TABS tablet Take 1 tablet by mouth at bedtime as needed.     fluticasone (FLONASE) 50 MCG/ACT nasal spray Place 1 spray into both nostrils daily as needed (for allergies.).      guaiFENesin-dextromethorphan (ROBITUSSIN DM) 100-10 MG/5ML syrup Take 5 mLs by mouth at bedtime as needed for cough.     HYDROcodone-acetaminophen (NORCO/VICODIN) 5-325 MG tablet Take 1 tablet by mouth every 6 (six) hours as needed (for lower back pain.). 20 tablet 0   ketoconazole (NIZORAL) 2 % cream Apply topically 2 (two) times daily as needed.     meclizine (ANTIVERT) 12.5 MG tablet TAKE 1 TO 2 TABLETS BY MOUTH THREE TIMES DAILY AS NEEDED FOR MOTION SICKNESS     Misc Natural Products (COLON CLEANSE PO) Take by mouth.     simvastatin (ZOCOR) 40 MG tablet TAKE ONE TABLET BY MOUTH AT BEDTIME     warfarin (COUMADIN) 5 MG tablet Take 5 mg by mouth at bedtime.      PFIZER COVID-19 VAC BIVALENT injection      No current facility-administered medications on file prior to visit.    There are no Patient Instructions on file for this visit. No follow-ups on file.   Kris Hartmann, NP

## 2022-07-01 DIAGNOSIS — I482 Chronic atrial fibrillation, unspecified: Secondary | ICD-10-CM | POA: Diagnosis not present

## 2022-07-01 DIAGNOSIS — Z79891 Long term (current) use of opiate analgesic: Secondary | ICD-10-CM | POA: Diagnosis not present

## 2022-07-10 DIAGNOSIS — G4733 Obstructive sleep apnea (adult) (pediatric): Secondary | ICD-10-CM | POA: Diagnosis not present

## 2022-07-23 DIAGNOSIS — Z79891 Long term (current) use of opiate analgesic: Secondary | ICD-10-CM | POA: Diagnosis not present

## 2022-07-23 DIAGNOSIS — I482 Chronic atrial fibrillation, unspecified: Secondary | ICD-10-CM | POA: Diagnosis not present

## 2022-08-01 DIAGNOSIS — J028 Acute pharyngitis due to other specified organisms: Secondary | ICD-10-CM | POA: Diagnosis not present

## 2022-08-01 DIAGNOSIS — Z03818 Encounter for observation for suspected exposure to other biological agents ruled out: Secondary | ICD-10-CM | POA: Diagnosis not present

## 2022-08-06 DIAGNOSIS — I482 Chronic atrial fibrillation, unspecified: Secondary | ICD-10-CM | POA: Diagnosis not present

## 2022-08-27 DIAGNOSIS — G4730 Sleep apnea, unspecified: Secondary | ICD-10-CM | POA: Diagnosis not present

## 2022-08-27 DIAGNOSIS — Z9889 Other specified postprocedural states: Secondary | ICD-10-CM | POA: Diagnosis not present

## 2022-08-27 DIAGNOSIS — I482 Chronic atrial fibrillation, unspecified: Secondary | ICD-10-CM | POA: Diagnosis not present

## 2022-08-27 DIAGNOSIS — I739 Peripheral vascular disease, unspecified: Secondary | ICD-10-CM | POA: Diagnosis not present

## 2022-08-27 DIAGNOSIS — I10 Essential (primary) hypertension: Secondary | ICD-10-CM | POA: Diagnosis not present

## 2022-08-27 DIAGNOSIS — I2510 Atherosclerotic heart disease of native coronary artery without angina pectoris: Secondary | ICD-10-CM | POA: Diagnosis not present

## 2022-08-27 DIAGNOSIS — E785 Hyperlipidemia, unspecified: Secondary | ICD-10-CM | POA: Diagnosis not present

## 2022-08-27 DIAGNOSIS — I714 Abdominal aortic aneurysm, without rupture, unspecified: Secondary | ICD-10-CM | POA: Diagnosis not present

## 2022-09-09 ENCOUNTER — Other Ambulatory Visit (INDEPENDENT_AMBULATORY_CARE_PROVIDER_SITE_OTHER): Payer: Self-pay | Admitting: Nurse Practitioner

## 2022-09-09 DIAGNOSIS — I714 Abdominal aortic aneurysm, without rupture, unspecified: Secondary | ICD-10-CM

## 2022-09-09 DIAGNOSIS — Z79891 Long term (current) use of opiate analgesic: Secondary | ICD-10-CM | POA: Diagnosis not present

## 2022-09-09 DIAGNOSIS — I482 Chronic atrial fibrillation, unspecified: Secondary | ICD-10-CM | POA: Diagnosis not present

## 2022-09-10 ENCOUNTER — Encounter (INDEPENDENT_AMBULATORY_CARE_PROVIDER_SITE_OTHER): Payer: Self-pay | Admitting: Vascular Surgery

## 2022-09-10 ENCOUNTER — Ambulatory Visit (INDEPENDENT_AMBULATORY_CARE_PROVIDER_SITE_OTHER): Payer: PPO

## 2022-09-10 ENCOUNTER — Ambulatory Visit (INDEPENDENT_AMBULATORY_CARE_PROVIDER_SITE_OTHER): Payer: PPO | Admitting: Vascular Surgery

## 2022-09-10 VITALS — BP 146/85 | HR 76 | Resp 19

## 2022-09-10 DIAGNOSIS — E785 Hyperlipidemia, unspecified: Secondary | ICD-10-CM | POA: Diagnosis not present

## 2022-09-10 DIAGNOSIS — I10 Essential (primary) hypertension: Secondary | ICD-10-CM | POA: Diagnosis not present

## 2022-09-10 DIAGNOSIS — I714 Abdominal aortic aneurysm, without rupture, unspecified: Secondary | ICD-10-CM

## 2022-09-10 DIAGNOSIS — I4891 Unspecified atrial fibrillation: Secondary | ICD-10-CM | POA: Diagnosis not present

## 2022-09-10 DIAGNOSIS — I7143 Infrarenal abdominal aortic aneurysm, without rupture: Secondary | ICD-10-CM

## 2022-09-10 NOTE — Assessment & Plan Note (Signed)
His duplex today shows some increase in the sac size back up to around 6 cm which is where it was at the time of his embolization.  We did notice potential left hypogastric artery branches that may need coil embolization, but at this point with the recent loss of his wife and sac size that has not increased dramatically but somewhat, were going to proceed with a short interval follow-up and not immediate intervention.  I will plan on seeing him back in about 3 months with a duplex and follow-up.  If he has continued growth, we would likely consider angiogram with possible embolization at that time.

## 2022-09-10 NOTE — Progress Notes (Signed)
MRN : [id number]  Richard Wilkins is a 87 y.o. (1934-12-09) male who presents with chief complaint of No chief complaint on file. [name] Kitchen  History of Present Illness: Patient returns today in follow up of his AAA.  About 4 months ago, he underwent embolization of right hypogastric artery branches for type II endoleak with enlargement of his aortic sac size.  His initial postprocedural duplex showed some regression of the sac size.  He is doing well today without specific complaints.  No aneurysm related symptoms.  His duplex today shows some increase in the sac size back up to around 6 cm which is where it was at the time of his embolization.  He also reports that he lost his wife last week after a long illness.  Current Outpatient Medications  Medication Sig Dispense Refill   acetaminophen (TYLENOL) 500 MG tablet Take 500-1,000 mg by mouth every 6 (six) hours as needed (for pain.).      alfuzosin (UROXATRAL) 10 MG 24 hr tablet Take 1 tablet (10 mg total) by mouth daily with breakfast.     aspirin EC 81 MG tablet Take 81 mg by mouth daily.     diltiazem (CARDIZEM CD) 180 MG 24 hr capsule TAKE ONE CAPSULE BY MOUTH ONCE DAILY     diphenhydramine-acetaminophen (TYLENOL PM) 25-500 MG TABS tablet Take 1 tablet by mouth at bedtime as needed.     fluticasone (FLONASE) 50 MCG/ACT nasal spray Place 1 spray into both nostrils daily as needed (for allergies.).      guaiFENesin-dextromethorphan (ROBITUSSIN DM) 100-10 MG/5ML syrup Take 5 mLs by mouth at bedtime as needed for cough.     HYDROcodone-acetaminophen (NORCO/VICODIN) 5-325 MG tablet Take 1 tablet by mouth every 6 (six) hours as needed (for lower back pain.). 20 tablet 0   ketoconazole (NIZORAL) 2 % cream Apply topically 2 (two) times daily as needed.     meclizine (ANTIVERT) 12.5 MG tablet TAKE 1 TO 2 TABLETS BY MOUTH THREE TIMES DAILY AS NEEDED FOR MOTION SICKNESS     Misc Natural Products (COLON CLEANSE PO) Take by mouth.     simvastatin (ZOCOR) 40  MG tablet TAKE ONE TABLET BY MOUTH AT BEDTIME     traZODone (DESYREL) 50 MG tablet Take 1 tablet by mouth at bedtime as needed.     warfarin (COUMADIN) 5 MG tablet Take 5 mg by mouth at bedtime.      No current facility-administered medications for this visit.    Past Medical History:  Diagnosis Date   Abdominal aortic aneurysm (AAA) (HCC)    Arthritis    Atrial fibrillation (HCC)    Cancer (HCC)    Chronic back pain    Colon polyp    Constipation    Coronary artery disease    Elevated lipids    Hemorrhage    Hyperlipidemia    Hypertension    Localized swelling of both lower legs    Myocardial infarction ([place])    2004 x 2 stents   Sleep apnea    CPAP    Past Surgical History:  Procedure Laterality Date   CARDIAC SURGERY     CATARACT EXTRACTION W/PHACO Right 12/27/2019   Procedure: CATARACT EXTRACTION PHACO AND INTRAOCULAR LENS PLACEMENT ([place]) RIGHT;  Surgeon: Eulogio Bear, MD;  Location: [facility];  Service: Ophthalmology;  Laterality: Right;  CDE 9.84 [place] 1:03.2   HEMORRHOID SURGERY     IRRIGATION AND DEBRIDEMENT HEMATOMA     LOWER EXTREMITY ANGIOGRAPHY  N/A 04/18/2022   Procedure: Lower Extremity Angiography;  Surgeon: Algernon Huxley, MD;  Location: [facility];  Service: Cardiovascular;  Laterality: N/A;   PERIPHERAL VASCULAR CATHETERIZATION N/A 09/25/2016   Procedure: Endovascular Repair/Stent Graft;  Surgeon: Algernon Huxley, MD;  Location: [facility];  Service: Cardiovascular;  Laterality: N/A;     Social History   Tobacco Use   Smoking status: Former    Types: Cigarettes    Quit date: 11/11/2002    Years since quitting: 19.8   Smokeless tobacco: Never  Vaping Use   Vaping Use: Never used  Substance Use Topics   Alcohol use: No   Drug use: No      Family History  Problem Relation Age of Onset   Cancer - Other Brother    Prostate cancer Neg Hx    Bladder Cancer Neg Hx    Kidney cancer Neg Hx      No Known  Allergies  REVIEW OF SYSTEMS (Negative unless checked)   Constitutional: '[]'$ Weight loss  '[]'$ Fever  '[]'$ Chills Cardiac: '[]'$ Chest pain   '[]'$ Chest pressure   '[x]'$ Palpitations   '[]'$ Shortness of breath when laying flat   '[]'$ Shortness of breath at rest   '[x]'$ Shortness of breath with exertion. Vascular:  '[]'$ Pain in legs with walking   '[]'$ Pain in legs at rest   '[]'$ Pain in legs when laying flat   '[]'$ Claudication   '[]'$ Pain in feet when walking  '[]'$ Pain in feet at rest  '[]'$ Pain in feet when laying flat   '[]'$ History of DVT   '[]'$ Phlebitis   '[x]'$ Swelling in legs   '[]'$ Varicose veins   '[]'$ Non-healing ulcers Pulmonary:   '[]'$ Uses home oxygen   '[]'$ Productive cough   '[]'$ Hemoptysis   '[]'$ Wheeze  '[]'$ COPD   '[]'$ Asthma Neurologic:  '[]'$ Dizziness  '[]'$ Blackouts   '[]'$ Seizures   '[]'$ History of stroke   '[]'$ History of TIA  '[]'$ Aphasia   '[]'$ Temporary blindness   '[]'$ Dysphagia   '[]'$ Weakness or numbness in arms   '[]'$ Weakness or numbness in legs Musculoskeletal:  '[x]'$ Arthritis   '[]'$ Joint swelling   '[x]'$ Joint pain   '[]'$ Low back pain Hematologic:  '[]'$ Easy bruising  '[]'$ Easy bleeding   '[]'$ Hypercoagulable state   '[]'$ Anemic   Gastrointestinal:  '[]'$ Blood in stool   '[]'$ Vomiting blood  '[x]'$ Gastroesophageal reflux/heartburn   '[]'$ Abdominal pain Genitourinary:  '[]'$ Chronic kidney disease   '[]'$ Difficult urination  '[]'$ Frequent urination  '[]'$ Burning with urination   '[]'$ Hematuria Skin:  '[]'$ Rashes   '[]'$ Ulcers   '[]'$ Wounds Psychological:  '[]'$ History of anxiety   '[]'$  History of major depression.  Physical Examination  BP (!) 146/85 (BP Location: Left Arm)   Pulse 76   Resp 19  Gen:  WD/WN, NAD Head: [place]/AT, No temporalis wasting. Ear/Nose/Throat: Hearing grossly intact, nares w/o erythema or drainage Eyes: Conjunctiva clear. Sclera non-icteric Neck: Supple.  Trachea midline Pulmonary:  Good air movement, no use of accessory muscles.  Cardiac: RRR, no JVD Vascular:  Vessel Right Left  Radial Palpable Palpable           Musculoskeletal: M/S 5/5 throughout.  No deformity or atrophy. Neurologic: Sensation  grossly intact in extremities.  Symmetrical.  Speech is fluent.  Psychiatric: Judgment intact, Mood & affect appropriate for pt's clinical situation. Dermatologic: No rashes or ulcers noted.  No cellulitis or open wounds.      Labs No results found for this or any previous visit (from the past 2160 hour(s)).  Radiology No results found.  Assessment/Plan Atrial fibrillation, chronic (HCC) On anticoagulation which seems to make type II endoleaks more  likely   Hypertension blood pressure control important in reducing the progression of atherosclerotic disease and aneurysmal growth. On appropriate oral medications.     Hyperlipidemia lipid control important in reducing the progression of atherosclerotic disease. Continue statin therapy  AAA (abdominal aortic aneurysm) without rupture (HCC) His duplex today shows some increase in the sac size back up to around 6 cm which is where it was at the time of his embolization.  We did notice potential left hypogastric artery branches that may need coil embolization, but at this point with the recent loss of his wife and sac size that has not increased dramatically but somewhat, were going to proceed with a short interval follow-up and not immediate intervention.  I will plan on seeing him back in about 3 months with a duplex and follow-up.  If he has continued growth, we would likely consider angiogram with possible embolization at that time.    Leotis Pain, MD  09/10/2022 2:36 PM    This note was created with Dragon medical transcription system.  Any errors from dictation are purely unintentional

## 2022-09-17 DIAGNOSIS — Z03818 Encounter for observation for suspected exposure to other biological agents ruled out: Secondary | ICD-10-CM | POA: Diagnosis not present

## 2022-09-17 DIAGNOSIS — J029 Acute pharyngitis, unspecified: Secondary | ICD-10-CM | POA: Diagnosis not present

## 2022-09-17 DIAGNOSIS — J019 Acute sinusitis, unspecified: Secondary | ICD-10-CM | POA: Diagnosis not present

## 2022-09-17 DIAGNOSIS — R051 Acute cough: Secondary | ICD-10-CM | POA: Diagnosis not present

## 2022-09-30 DIAGNOSIS — I482 Chronic atrial fibrillation, unspecified: Secondary | ICD-10-CM | POA: Diagnosis not present

## 2022-10-14 DIAGNOSIS — I482 Chronic atrial fibrillation, unspecified: Secondary | ICD-10-CM | POA: Diagnosis not present

## 2022-11-13 DIAGNOSIS — Z79891 Long term (current) use of opiate analgesic: Secondary | ICD-10-CM | POA: Diagnosis not present

## 2022-11-13 DIAGNOSIS — I482 Chronic atrial fibrillation, unspecified: Secondary | ICD-10-CM | POA: Diagnosis not present

## 2022-11-13 DIAGNOSIS — E538 Deficiency of other specified B group vitamins: Secondary | ICD-10-CM | POA: Diagnosis not present

## 2022-11-13 DIAGNOSIS — I10 Essential (primary) hypertension: Secondary | ICD-10-CM | POA: Diagnosis not present

## 2022-11-13 DIAGNOSIS — E785 Hyperlipidemia, unspecified: Secondary | ICD-10-CM | POA: Diagnosis not present

## 2022-11-20 DIAGNOSIS — E785 Hyperlipidemia, unspecified: Secondary | ICD-10-CM | POA: Diagnosis not present

## 2022-11-20 DIAGNOSIS — D692 Other nonthrombocytopenic purpura: Secondary | ICD-10-CM | POA: Diagnosis not present

## 2022-11-20 DIAGNOSIS — D6869 Other thrombophilia: Secondary | ICD-10-CM | POA: Diagnosis not present

## 2022-11-20 DIAGNOSIS — I482 Chronic atrial fibrillation, unspecified: Secondary | ICD-10-CM | POA: Diagnosis not present

## 2022-11-20 DIAGNOSIS — I2510 Atherosclerotic heart disease of native coronary artery without angina pectoris: Secondary | ICD-10-CM | POA: Diagnosis not present

## 2022-11-20 DIAGNOSIS — Z79891 Long term (current) use of opiate analgesic: Secondary | ICD-10-CM | POA: Diagnosis not present

## 2022-11-20 DIAGNOSIS — G4730 Sleep apnea, unspecified: Secondary | ICD-10-CM | POA: Diagnosis not present

## 2022-11-20 DIAGNOSIS — E538 Deficiency of other specified B group vitamins: Secondary | ICD-10-CM | POA: Diagnosis not present

## 2022-11-20 DIAGNOSIS — I714 Abdominal aortic aneurysm, without rupture, unspecified: Secondary | ICD-10-CM | POA: Diagnosis not present

## 2022-11-20 DIAGNOSIS — I10 Essential (primary) hypertension: Secondary | ICD-10-CM | POA: Diagnosis not present

## 2022-11-20 DIAGNOSIS — I739 Peripheral vascular disease, unspecified: Secondary | ICD-10-CM | POA: Diagnosis not present

## 2022-12-10 ENCOUNTER — Ambulatory Visit (INDEPENDENT_AMBULATORY_CARE_PROVIDER_SITE_OTHER): Payer: PPO | Admitting: Vascular Surgery

## 2022-12-10 ENCOUNTER — Encounter (INDEPENDENT_AMBULATORY_CARE_PROVIDER_SITE_OTHER): Payer: Self-pay | Admitting: Vascular Surgery

## 2022-12-10 ENCOUNTER — Ambulatory Visit (INDEPENDENT_AMBULATORY_CARE_PROVIDER_SITE_OTHER): Payer: PPO

## 2022-12-10 VITALS — BP 121/72 | HR 78 | Resp 16 | Wt 270.0 lb

## 2022-12-10 DIAGNOSIS — I714 Abdominal aortic aneurysm, without rupture, unspecified: Secondary | ICD-10-CM | POA: Diagnosis not present

## 2022-12-10 DIAGNOSIS — G4733 Obstructive sleep apnea (adult) (pediatric): Secondary | ICD-10-CM | POA: Diagnosis not present

## 2022-12-10 DIAGNOSIS — E785 Hyperlipidemia, unspecified: Secondary | ICD-10-CM

## 2022-12-10 DIAGNOSIS — I482 Chronic atrial fibrillation, unspecified: Secondary | ICD-10-CM | POA: Diagnosis not present

## 2022-12-10 DIAGNOSIS — I10 Essential (primary) hypertension: Secondary | ICD-10-CM

## 2022-12-10 NOTE — Progress Notes (Signed)
MRN : [id number]  Richard Wilkins is a 88 y.o. (04-Aug-1935) male who presents with chief complaint of  Chief Complaint  Patient presents with   Follow-up    Ultrasound follow up  .  History of Present Illness: Patient returns today in follow up of his abdominal aortic aneurysm.  He is a little over 6 months status post endovascular treatment for a type II endoleak with coil embolization of hypogastric artery branches.  He is doing well today.  He denies any aneurysm related symptoms. Specifically, the patient denies new back or abdominal pain, or signs of peripheral embolization.  Duplex today shows no obvious endoleak with a patent stent graft.  His aortic sac size has decreased roughly a centimeter down to just over 5 cm in maximal diameter.  Current Outpatient Medications  Medication Sig Dispense Refill   acetaminophen (TYLENOL) 500 MG tablet Take 500-1,000 mg by mouth every 6 (six) hours as needed (for pain.).      alfuzosin (UROXATRAL) 10 MG 24 hr tablet Take 1 tablet (10 mg total) by mouth daily with breakfast.     aspirin EC 81 MG tablet Take 81 mg by mouth daily.     diltiazem (CARDIZEM CD) 180 MG 24 hr capsule TAKE ONE CAPSULE BY MOUTH ONCE DAILY     diphenhydramine-acetaminophen (TYLENOL PM) 25-500 MG TABS tablet Take 1 tablet by mouth at bedtime as needed.     fluticasone (FLONASE) 50 MCG/ACT nasal spray Place 1 spray into both nostrils daily as needed (for allergies.).      guaiFENesin-dextromethorphan (ROBITUSSIN DM) 100-10 MG/5ML syrup Take 5 mLs by mouth at bedtime as needed for cough.     HYDROcodone-acetaminophen (NORCO/VICODIN) 5-325 MG tablet Take 1 tablet by mouth every 6 (six) hours as needed (for lower back pain.). 20 tablet 0   ketoconazole (NIZORAL) 2 % cream Apply topically 2 (two) times daily as needed.     meclizine (ANTIVERT) 12.5 MG tablet TAKE 1 TO 2 TABLETS BY MOUTH THREE TIMES DAILY AS NEEDED FOR MOTION SICKNESS     Misc Natural Products (COLON CLEANSE PO)  Take by mouth.     simvastatin (ZOCOR) 40 MG tablet TAKE ONE TABLET BY MOUTH AT BEDTIME     traZODone (DESYREL) 50 MG tablet Take 1 tablet by mouth at bedtime as needed.     warfarin (COUMADIN) 5 MG tablet Take 5 mg by mouth at bedtime.      No current facility-administered medications for this visit.    Past Medical History:  Diagnosis Date   Abdominal aortic aneurysm (AAA) (HCC)    Arthritis    Atrial fibrillation (HCC)    Cancer (HCC)    Chronic back pain    Colon polyp    Constipation    Coronary artery disease    Elevated lipids    Hemorrhage    Hyperlipidemia    Hypertension    Localized swelling of both lower legs    Myocardial infarction ([place])    2004 x 2 stents   Sleep apnea    CPAP    Past Surgical History:  Procedure Laterality Date   CARDIAC SURGERY     CATARACT EXTRACTION W/PHACO Right 12/27/2019   Procedure: CATARACT EXTRACTION PHACO AND INTRAOCULAR LENS PLACEMENT ([place]) RIGHT;  Surgeon: Eulogio Bear, MD;  Location: [place];  Service: Ophthalmology;  Laterality: Right;  CDE 9.84 [place] 1:03.2   HEMORRHOID SURGERY     IRRIGATION AND DEBRIDEMENT HEMATOMA  LOWER EXTREMITY ANGIOGRAPHY N/A 04/18/2022   Procedure: Lower Extremity Angiography;  Surgeon: Algernon Huxley, MD;  Location: [facility];  Service: Cardiovascular;  Laterality: N/A;   PERIPHERAL VASCULAR CATHETERIZATION N/A 09/25/2016   Procedure: Endovascular Repair/Stent Graft;  Surgeon: Algernon Huxley, MD;  Location: [facility];  Service: Cardiovascular;  Laterality: N/A;     Social History   Tobacco Use   Smoking status: Former    Types: Cigarettes    Quit date: 11/11/2002    Years since quitting: 20.0   Smokeless tobacco: Never  Vaping Use   Vaping Use: Never used  Substance Use Topics   Alcohol use: No   Drug use: No      Family History  Problem Relation Age of Onset   Cancer - Other Brother    Prostate cancer Neg Hx    Bladder Cancer Neg Hx    Kidney  cancer Neg Hx      No Known Allergies   REVIEW OF SYSTEMS (Negative unless checked)   Constitutional: '[]'$ Weight loss  '[]'$ Fever  '[]'$ Chills Cardiac: '[]'$ Chest pain   '[]'$ Chest pressure   '[x]'$ Palpitations   '[]'$ Shortness of breath when laying flat   '[]'$ Shortness of breath at rest   '[x]'$ Shortness of breath with exertion. Vascular:  '[]'$ Pain in legs with walking   '[]'$ Pain in legs at rest   '[]'$ Pain in legs when laying flat   '[]'$ Claudication   '[]'$ Pain in feet when walking  '[]'$ Pain in feet at rest  '[]'$ Pain in feet when laying flat   '[]'$ History of DVT   '[]'$ Phlebitis   '[x]'$ Swelling in legs   '[]'$ Varicose veins   '[]'$ Non-healing ulcers Pulmonary:   '[]'$ Uses home oxygen   '[]'$ Productive cough   '[]'$ Hemoptysis   '[]'$ Wheeze  '[]'$ COPD   '[]'$ Asthma Neurologic:  '[]'$ Dizziness  '[]'$ Blackouts   '[]'$ Seizures   '[]'$ History of stroke   '[]'$ History of TIA  '[]'$ Aphasia   '[]'$ Temporary blindness   '[]'$ Dysphagia   '[]'$ Weakness or numbness in arms   '[]'$ Weakness or numbness in legs Musculoskeletal:  '[x]'$ Arthritis   '[]'$ Joint swelling   '[x]'$ Joint pain   '[]'$ Low back pain Hematologic:  '[]'$ Easy bruising  '[]'$ Easy bleeding   '[]'$ Hypercoagulable state   '[]'$ Anemic   Gastrointestinal:  '[]'$ Blood in stool   '[]'$ Vomiting blood  '[x]'$ Gastroesophageal reflux/heartburn   '[]'$ Abdominal pain Genitourinary:  '[]'$ Chronic kidney disease   '[]'$ Difficult urination  '[]'$ Frequent urination  '[]'$ Burning with urination   '[]'$ Hematuria Skin:  '[]'$ Rashes   '[]'$ Ulcers   '[]'$ Wounds Psychological:  '[]'$ History of anxiety   '[]'$  History of major depression.   Physical Examination  BP 121/72 (BP Location: Left Arm)   Pulse 78   Resp 16   Wt 270 lb (122.5 kg)   BMI 38.74 kg/m  Gen:  WD/WN, NAD. obese Head: [place]/AT, No temporalis wasting. Ear/Nose/Throat: Hearing grossly intact, nares w/o erythema or drainage Eyes: Conjunctiva clear. Sclera non-icteric Neck: Supple.  Trachea midline Pulmonary:  Good air movement, no use of accessory muscles.  Cardiac: RRR, no JVD Vascular:  Vessel Right Left  Radial Palpable Palpable                                    Gastrointestinal: soft, non-tender/non-distended. No guarding/reflex.  Musculoskeletal: M/S 5/5 throughout.  No deformity or atrophy. Trace LE edema. Neurologic: Sensation grossly intact in extremities.  Symmetrical.  Speech is fluent.  Psychiatric: Judgment intact, Mood & affect appropriate for pt's clinical situation. Dermatologic: No rashes  or ulcers noted.  No cellulitis or open wounds.      Labs No results found for this or any previous visit (from the past 2160 hour(s)).  Radiology No results found.  Assessment/Plan Atrial fibrillation, chronic (HCC) On anticoagulation which seems to make type II endoleaks more likely   Hypertension blood pressure control important in reducing the progression of atherosclerotic disease and aneurysmal growth. On appropriate oral medications.     Hyperlipidemia lipid control important in reducing the progression of atherosclerotic disease. Continue statin therapy  Abdominal aortic aneurysm (HCC) Duplex today shows no obvious endoleak with a patent stent graft.  His aortic sac size has decreased roughly a centimeter down to just over 5 cm in maximal diameter. This is very encouraging.  No change in his medications at this time.  I will stretch out his follow-up and see him in 6 months at this point.    Leotis Pain, MD  12/10/2022 10:56 AM    This note was created with Dragon medical transcription system.  Any errors from dictation are purely unintentional

## 2022-12-10 NOTE — Assessment & Plan Note (Signed)
Duplex today shows no obvious endoleak with a patent stent graft.  His aortic sac size has decreased roughly a centimeter down to just over 5 cm in maximal diameter. This is very encouraging.  No change in his medications at this time.  I will stretch out his follow-up and see him in 6 months at this point.

## 2022-12-11 DIAGNOSIS — I482 Chronic atrial fibrillation, unspecified: Secondary | ICD-10-CM | POA: Diagnosis not present

## 2023-01-08 DIAGNOSIS — I482 Chronic atrial fibrillation, unspecified: Secondary | ICD-10-CM | POA: Diagnosis not present

## 2023-01-08 DIAGNOSIS — Z7901 Long term (current) use of anticoagulants: Secondary | ICD-10-CM | POA: Diagnosis not present

## 2023-01-27 DIAGNOSIS — Z79891 Long term (current) use of opiate analgesic: Secondary | ICD-10-CM | POA: Diagnosis not present

## 2023-01-27 DIAGNOSIS — I482 Chronic atrial fibrillation, unspecified: Secondary | ICD-10-CM | POA: Diagnosis not present

## 2023-02-12 DIAGNOSIS — E785 Hyperlipidemia, unspecified: Secondary | ICD-10-CM | POA: Diagnosis not present

## 2023-02-12 DIAGNOSIS — E538 Deficiency of other specified B group vitamins: Secondary | ICD-10-CM | POA: Diagnosis not present

## 2023-02-12 DIAGNOSIS — I482 Chronic atrial fibrillation, unspecified: Secondary | ICD-10-CM | POA: Diagnosis not present

## 2023-02-12 DIAGNOSIS — I10 Essential (primary) hypertension: Secondary | ICD-10-CM | POA: Diagnosis not present

## 2023-02-12 DIAGNOSIS — Z7901 Long term (current) use of anticoagulants: Secondary | ICD-10-CM | POA: Diagnosis not present

## 2023-02-12 DIAGNOSIS — Z79891 Long term (current) use of opiate analgesic: Secondary | ICD-10-CM | POA: Diagnosis not present

## 2023-02-13 DIAGNOSIS — E538 Deficiency of other specified B group vitamins: Secondary | ICD-10-CM | POA: Diagnosis not present

## 2023-02-13 DIAGNOSIS — E785 Hyperlipidemia, unspecified: Secondary | ICD-10-CM | POA: Diagnosis not present

## 2023-02-13 DIAGNOSIS — I10 Essential (primary) hypertension: Secondary | ICD-10-CM | POA: Diagnosis not present

## 2023-02-19 DIAGNOSIS — G4730 Sleep apnea, unspecified: Secondary | ICD-10-CM | POA: Diagnosis not present

## 2023-02-19 DIAGNOSIS — D6869 Other thrombophilia: Secondary | ICD-10-CM | POA: Diagnosis not present

## 2023-02-19 DIAGNOSIS — I739 Peripheral vascular disease, unspecified: Secondary | ICD-10-CM | POA: Diagnosis not present

## 2023-02-19 DIAGNOSIS — D692 Other nonthrombocytopenic purpura: Secondary | ICD-10-CM | POA: Diagnosis not present

## 2023-02-19 DIAGNOSIS — E785 Hyperlipidemia, unspecified: Secondary | ICD-10-CM | POA: Diagnosis not present

## 2023-02-19 DIAGNOSIS — E538 Deficiency of other specified B group vitamins: Secondary | ICD-10-CM | POA: Diagnosis not present

## 2023-02-19 DIAGNOSIS — Z79891 Long term (current) use of opiate analgesic: Secondary | ICD-10-CM | POA: Diagnosis not present

## 2023-02-19 DIAGNOSIS — I714 Abdominal aortic aneurysm, without rupture, unspecified: Secondary | ICD-10-CM | POA: Diagnosis not present

## 2023-02-19 DIAGNOSIS — I482 Chronic atrial fibrillation, unspecified: Secondary | ICD-10-CM | POA: Diagnosis not present

## 2023-02-19 DIAGNOSIS — I10 Essential (primary) hypertension: Secondary | ICD-10-CM | POA: Diagnosis not present

## 2023-02-26 DIAGNOSIS — I739 Peripheral vascular disease, unspecified: Secondary | ICD-10-CM | POA: Diagnosis not present

## 2023-02-26 DIAGNOSIS — I482 Chronic atrial fibrillation, unspecified: Secondary | ICD-10-CM | POA: Diagnosis not present

## 2023-02-26 DIAGNOSIS — Z9889 Other specified postprocedural states: Secondary | ICD-10-CM | POA: Diagnosis not present

## 2023-02-26 DIAGNOSIS — I2510 Atherosclerotic heart disease of native coronary artery without angina pectoris: Secondary | ICD-10-CM | POA: Diagnosis not present

## 2023-02-26 DIAGNOSIS — I714 Abdominal aortic aneurysm, without rupture, unspecified: Secondary | ICD-10-CM | POA: Diagnosis not present

## 2023-02-26 DIAGNOSIS — I10 Essential (primary) hypertension: Secondary | ICD-10-CM | POA: Diagnosis not present

## 2023-02-26 DIAGNOSIS — Z7901 Long term (current) use of anticoagulants: Secondary | ICD-10-CM | POA: Diagnosis not present

## 2023-02-26 DIAGNOSIS — G4730 Sleep apnea, unspecified: Secondary | ICD-10-CM | POA: Diagnosis not present

## 2023-02-26 DIAGNOSIS — E785 Hyperlipidemia, unspecified: Secondary | ICD-10-CM | POA: Diagnosis not present

## 2023-03-10 DIAGNOSIS — G4733 Obstructive sleep apnea (adult) (pediatric): Secondary | ICD-10-CM | POA: Diagnosis not present

## 2023-03-27 DIAGNOSIS — L538 Other specified erythematous conditions: Secondary | ICD-10-CM | POA: Diagnosis not present

## 2023-03-27 DIAGNOSIS — D485 Neoplasm of uncertain behavior of skin: Secondary | ICD-10-CM | POA: Diagnosis not present

## 2023-03-27 DIAGNOSIS — L820 Inflamed seborrheic keratosis: Secondary | ICD-10-CM | POA: Diagnosis not present

## 2023-03-27 DIAGNOSIS — D2262 Melanocytic nevi of left upper limb, including shoulder: Secondary | ICD-10-CM | POA: Diagnosis not present

## 2023-03-27 DIAGNOSIS — D2261 Melanocytic nevi of right upper limb, including shoulder: Secondary | ICD-10-CM | POA: Diagnosis not present

## 2023-03-27 DIAGNOSIS — D2272 Melanocytic nevi of left lower limb, including hip: Secondary | ICD-10-CM | POA: Diagnosis not present

## 2023-03-27 DIAGNOSIS — D2271 Melanocytic nevi of right lower limb, including hip: Secondary | ICD-10-CM | POA: Diagnosis not present

## 2023-03-27 DIAGNOSIS — D225 Melanocytic nevi of trunk: Secondary | ICD-10-CM | POA: Diagnosis not present

## 2023-03-27 DIAGNOSIS — L858 Other specified epidermal thickening: Secondary | ICD-10-CM | POA: Diagnosis not present

## 2023-03-27 DIAGNOSIS — Z85828 Personal history of other malignant neoplasm of skin: Secondary | ICD-10-CM | POA: Diagnosis not present

## 2023-03-27 DIAGNOSIS — L821 Other seborrheic keratosis: Secondary | ICD-10-CM | POA: Diagnosis not present

## 2023-03-27 DIAGNOSIS — Z08 Encounter for follow-up examination after completed treatment for malignant neoplasm: Secondary | ICD-10-CM | POA: Diagnosis not present

## 2023-03-28 DIAGNOSIS — Z7901 Long term (current) use of anticoagulants: Secondary | ICD-10-CM | POA: Diagnosis not present

## 2023-03-28 DIAGNOSIS — I482 Chronic atrial fibrillation, unspecified: Secondary | ICD-10-CM | POA: Diagnosis not present

## 2023-04-24 DIAGNOSIS — Z7901 Long term (current) use of anticoagulants: Secondary | ICD-10-CM | POA: Diagnosis not present

## 2023-04-24 DIAGNOSIS — I482 Chronic atrial fibrillation, unspecified: Secondary | ICD-10-CM | POA: Diagnosis not present

## 2023-05-22 DIAGNOSIS — I482 Chronic atrial fibrillation, unspecified: Secondary | ICD-10-CM | POA: Diagnosis not present

## 2023-06-11 DIAGNOSIS — G4733 Obstructive sleep apnea (adult) (pediatric): Secondary | ICD-10-CM | POA: Diagnosis not present

## 2023-06-13 ENCOUNTER — Encounter (INDEPENDENT_AMBULATORY_CARE_PROVIDER_SITE_OTHER): Payer: Self-pay | Admitting: Vascular Surgery

## 2023-06-13 ENCOUNTER — Ambulatory Visit (INDEPENDENT_AMBULATORY_CARE_PROVIDER_SITE_OTHER): Payer: PPO

## 2023-06-13 ENCOUNTER — Ambulatory Visit (INDEPENDENT_AMBULATORY_CARE_PROVIDER_SITE_OTHER): Payer: PPO | Admitting: Vascular Surgery

## 2023-06-13 VITALS — BP 151/95 | HR 81 | Resp 18 | Ht 70.0 in | Wt 269.4 lb

## 2023-06-13 DIAGNOSIS — I482 Chronic atrial fibrillation, unspecified: Secondary | ICD-10-CM | POA: Diagnosis not present

## 2023-06-13 DIAGNOSIS — I10 Essential (primary) hypertension: Secondary | ICD-10-CM | POA: Diagnosis not present

## 2023-06-13 DIAGNOSIS — I714 Abdominal aortic aneurysm, without rupture, unspecified: Secondary | ICD-10-CM | POA: Diagnosis not present

## 2023-06-13 DIAGNOSIS — I7143 Infrarenal abdominal aortic aneurysm, without rupture: Secondary | ICD-10-CM

## 2023-06-13 DIAGNOSIS — E785 Hyperlipidemia, unspecified: Secondary | ICD-10-CM | POA: Diagnosis not present

## 2023-06-13 NOTE — Progress Notes (Signed)
MRN : [id number]  Richard Wilkins is a 88 y.o. ([birth year]) male who presents with chief complaint of  Chief Complaint  Patient presents with   Follow-up    f/u in 6 months with EVAR  .  History of Present Illness: Patient returns today in follow up of his abdominal aortic aneurysm.  About a year ago, he underwent coil embolization of branches feeding a type II endoleak for sac growth after previous endovascular repair.  He had had quite a bit of sac growth despite his previous endovascular repair and has a litany of medical issues as below.  Since that time, he has done well.  No aneurysm related symptoms. Specifically, the patient denies new back or abdominal pain, or signs of peripheral embolization.  His duplex today shows no endoleak with a patent stent graft.  The aortic sac size has decreased several more millimeters down to 4.7 cm in maximal diameter.  Current Outpatient Medications  Medication Sig Dispense Refill   acetaminophen (TYLENOL) 500 MG tablet Take 500-1,000 mg by mouth every 6 (six) hours as needed (for pain.).      alfuzosin (UROXATRAL) 10 MG 24 hr tablet Take 1 tablet (10 mg total) by mouth daily with breakfast.     aspirin EC 81 MG tablet Take 81 mg by mouth daily.     diltiazem (CARDIZEM CD) 180 MG 24 hr capsule TAKE ONE CAPSULE BY MOUTH ONCE DAILY     diphenhydramine-acetaminophen (TYLENOL PM) 25-500 MG TABS tablet Take 1 tablet by mouth at bedtime as needed.     fluticasone (FLONASE) 50 MCG/ACT nasal spray Place 1 spray into both nostrils daily as needed (for allergies.).      guaiFENesin-dextromethorphan (ROBITUSSIN DM) 100-10 MG/5ML syrup Take 5 mLs by mouth at bedtime as needed for cough.     HYDROcodone-acetaminophen (NORCO/VICODIN) 5-325 MG tablet Take 1 tablet by mouth every 6 (six) hours as needed (for lower back pain.). 20 tablet 0   ketoconazole (NIZORAL) 2 % cream Apply topically 2 (two) times daily as needed.     meclizine (ANTIVERT) 12.5 MG tablet TAKE 1  TO 2 TABLETS BY MOUTH THREE TIMES DAILY AS NEEDED FOR MOTION SICKNESS     Misc Natural Products (COLON CLEANSE PO) Take by mouth.     simvastatin (ZOCOR) 40 MG tablet TAKE ONE TABLET BY MOUTH AT BEDTIME     traZODone (DESYREL) 50 MG tablet Take 1 tablet by mouth at bedtime as needed.     warfarin (COUMADIN) 5 MG tablet Take 5 mg by mouth at bedtime.      No current facility-administered medications for this visit.    Past Medical History:  Diagnosis Date   Abdominal aortic aneurysm (AAA) (HCC)    Arthritis    Atrial fibrillation (HCC)    Cancer (HCC)    Chronic back pain    Colon polyp    Constipation    Coronary artery disease    Elevated lipids    Hemorrhage    Hyperlipidemia    Hypertension    Localized swelling of both lower legs    Myocardial infarction (HCC)    2004 x 2 stents   Sleep apnea    CPAP    Past Surgical History:  Procedure Laterality Date   CARDIAC SURGERY     CATARACT EXTRACTION W/PHACO Right 12/27/2019   Procedure: CATARACT EXTRACTION PHACO AND INTRAOCULAR LENS PLACEMENT ([facility]) RIGHT;  Surgeon: Nevada Crane, MD;  Location: [facility];  Service: Ophthalmology;  Laterality: Right;  CDE 9.84 [place] 1:03.2   HEMORRHOID SURGERY     IRRIGATION AND DEBRIDEMENT HEMATOMA     LOWER EXTREMITY ANGIOGRAPHY N/A 04/18/2022   Procedure: Lower Extremity Angiography;  Surgeon: Annice Needy, MD;  Location: [facility];  Service: Cardiovascular;  Laterality: N/A;   PERIPHERAL VASCULAR CATHETERIZATION N/A 09/25/2016   Procedure: Endovascular Repair/Stent Graft;  Surgeon: Annice Needy, MD;  Location: [facility];  Service: Cardiovascular;  Laterality: N/A;     Social History   Tobacco Use   Smoking status: Former    Current packs/day: 0.00    Types: Cigarettes    Quit date: 11/11/2002    Years since quitting: 20.6   Smokeless tobacco: Never  Vaping Use   Vaping status: Never Used  Substance Use Topics   Alcohol use: No   Drug use: No      Family History  Problem Relation Age of Onset   Cancer - Other Brother    Prostate cancer Neg Hx    Bladder Cancer Neg Hx    Kidney cancer Neg Hx      No Known Allergies  REVIEW OF SYSTEMS (Negative unless checked)   Constitutional: [] Weight loss  [] Fever  [] Chills Cardiac: [] Chest pain   [] Chest pressure   [x] Palpitations   [] Shortness of breath when laying flat   [] Shortness of breath at rest   [x] Shortness of breath with exertion. Vascular:  [] Pain in legs with walking   [] Pain in legs at rest   [] Pain in legs when laying flat   [] Claudication   [] Pain in feet when walking  [] Pain in feet at rest  [] Pain in feet when laying flat   [] History of DVT   [] Phlebitis   [x] Swelling in legs   [] Varicose veins   [] Non-healing ulcers Pulmonary:   [] Uses home oxygen   [] Productive cough   [] Hemoptysis   [] Wheeze  [] COPD   [] Asthma Neurologic:  [] Dizziness  [] Blackouts   [] Seizures   [] History of stroke   [] History of TIA  [] Aphasia   [] Temporary blindness   [] Dysphagia   [] Weakness or numbness in arms   [] Weakness or numbness in legs Musculoskeletal:  [x] Arthritis   [] Joint swelling   [x] Joint pain   [] Low back pain Hematologic:  [] Easy bruising  [] Easy bleeding   [] Hypercoagulable state   [] Anemic   Gastrointestinal:  [] Blood in stool   [] Vomiting blood  [x] Gastroesophageal reflux/heartburn   [] Abdominal pain Genitourinary:  [] Chronic kidney disease   [] Difficult urination  [] Frequent urination  [] Burning with urination   [] Hematuria Skin:  [] Rashes   [] Ulcers   [] Wounds Psychological:  [] History of anxiety   []  History of major depression.   Physical Examination  BP (!) 151/95 (BP Location: Left Arm)   Pulse 81   Resp 18   Ht 5\' 10"  (1.778 m)   Wt 269 lb 6.4 oz (122.2 kg)   BMI 38.65 kg/m  Gen:  WD/WN, NAD Head: [place]/AT, No temporalis wasting. Ear/Nose/Throat: Hearing grossly intact, nares w/o erythema or drainage Eyes: Conjunctiva clear. Sclera non-icteric Neck: Supple.   Trachea midline Pulmonary:  Good air movement, no use of accessory muscles.  Cardiac: RRR, no JVD Vascular:  Vessel Right Left  Radial Palpable Palpable           Musculoskeletal: M/S 5/5 throughout.  No deformity or atrophy. Mild LE edema. Neurologic: Sensation grossly intact in extremities.  Symmetrical.  Speech is fluent.  Psychiatric: Judgment intact, Mood & affect appropriate for  pt's clinical situation. Dermatologic: No rashes or ulcers noted.  No cellulitis or open wounds.      Labs No results found for this or any previous visit (from the past 2160 hour(s)).  Radiology No results found.  Assessment/Plan Atrial fibrillation, chronic (HCC) On anticoagulation which seems to make type II endoleaks more likely   Hypertension blood pressure control important in reducing the progression of atherosclerotic disease and aneurysmal growth. On appropriate oral medications.     Hyperlipidemia lipid control important in reducing the progression of atherosclerotic disease. Continue statin therapy   Abdominal aortic aneurysm (HCC) Duplex today shows no obvious endoleak with a patent stent graft.  His aortic sac size has decreased to 4.7 cm in maximal diameter. Doing well.  Continue 41-month interval follow-ups with surveillance duplex after history of intervention.  No changes in medical regimen.   Festus Barren, MD  06/13/2023 10:12 AM    This note was created with Dragon medical transcription system.  Any errors from dictation are purely unintentional

## 2023-06-24 DIAGNOSIS — I482 Chronic atrial fibrillation, unspecified: Secondary | ICD-10-CM | POA: Diagnosis not present

## 2023-06-24 DIAGNOSIS — Z79891 Long term (current) use of opiate analgesic: Secondary | ICD-10-CM | POA: Diagnosis not present

## 2023-06-24 DIAGNOSIS — E785 Hyperlipidemia, unspecified: Secondary | ICD-10-CM | POA: Diagnosis not present

## 2023-06-24 DIAGNOSIS — I10 Essential (primary) hypertension: Secondary | ICD-10-CM | POA: Diagnosis not present

## 2023-07-01 DIAGNOSIS — I739 Peripheral vascular disease, unspecified: Secondary | ICD-10-CM | POA: Diagnosis not present

## 2023-07-01 DIAGNOSIS — I10 Essential (primary) hypertension: Secondary | ICD-10-CM | POA: Diagnosis not present

## 2023-07-01 DIAGNOSIS — Z79891 Long term (current) use of opiate analgesic: Secondary | ICD-10-CM | POA: Diagnosis not present

## 2023-07-01 DIAGNOSIS — E785 Hyperlipidemia, unspecified: Secondary | ICD-10-CM | POA: Diagnosis not present

## 2023-07-01 DIAGNOSIS — Z0000 Encounter for general adult medical examination without abnormal findings: Secondary | ICD-10-CM | POA: Diagnosis not present

## 2023-07-01 DIAGNOSIS — I714 Abdominal aortic aneurysm, without rupture, unspecified: Secondary | ICD-10-CM | POA: Diagnosis not present

## 2023-07-01 DIAGNOSIS — D692 Other nonthrombocytopenic purpura: Secondary | ICD-10-CM | POA: Diagnosis not present

## 2023-07-01 DIAGNOSIS — E538 Deficiency of other specified B group vitamins: Secondary | ICD-10-CM | POA: Diagnosis not present

## 2023-07-01 DIAGNOSIS — D6869 Other thrombophilia: Secondary | ICD-10-CM | POA: Diagnosis not present

## 2023-07-01 DIAGNOSIS — Z66 Do not resuscitate: Secondary | ICD-10-CM | POA: Diagnosis not present

## 2023-07-01 DIAGNOSIS — I482 Chronic atrial fibrillation, unspecified: Secondary | ICD-10-CM | POA: Diagnosis not present

## 2023-07-24 DIAGNOSIS — Z7901 Long term (current) use of anticoagulants: Secondary | ICD-10-CM | POA: Diagnosis not present

## 2023-07-24 DIAGNOSIS — I482 Chronic atrial fibrillation, unspecified: Secondary | ICD-10-CM | POA: Diagnosis not present

## 2023-08-28 DIAGNOSIS — I714 Abdominal aortic aneurysm, without rupture, unspecified: Secondary | ICD-10-CM | POA: Diagnosis not present

## 2023-08-28 DIAGNOSIS — I739 Peripheral vascular disease, unspecified: Secondary | ICD-10-CM | POA: Diagnosis not present

## 2023-08-28 DIAGNOSIS — Z7901 Long term (current) use of anticoagulants: Secondary | ICD-10-CM | POA: Diagnosis not present

## 2023-08-28 DIAGNOSIS — I482 Chronic atrial fibrillation, unspecified: Secondary | ICD-10-CM | POA: Diagnosis not present

## 2023-08-28 DIAGNOSIS — I10 Essential (primary) hypertension: Secondary | ICD-10-CM | POA: Diagnosis not present

## 2023-08-28 DIAGNOSIS — G4730 Sleep apnea, unspecified: Secondary | ICD-10-CM | POA: Diagnosis not present

## 2023-08-28 DIAGNOSIS — E785 Hyperlipidemia, unspecified: Secondary | ICD-10-CM | POA: Diagnosis not present

## 2023-08-28 DIAGNOSIS — Z9889 Other specified postprocedural states: Secondary | ICD-10-CM | POA: Diagnosis not present

## 2023-08-28 DIAGNOSIS — I2510 Atherosclerotic heart disease of native coronary artery without angina pectoris: Secondary | ICD-10-CM | POA: Diagnosis not present

## 2023-09-25 DIAGNOSIS — I482 Chronic atrial fibrillation, unspecified: Secondary | ICD-10-CM | POA: Diagnosis not present

## 2023-09-25 DIAGNOSIS — Z7901 Long term (current) use of anticoagulants: Secondary | ICD-10-CM | POA: Diagnosis not present

## 2023-10-16 DIAGNOSIS — Z7901 Long term (current) use of anticoagulants: Secondary | ICD-10-CM | POA: Diagnosis not present

## 2023-10-16 DIAGNOSIS — I482 Chronic atrial fibrillation, unspecified: Secondary | ICD-10-CM | POA: Diagnosis not present

## 2023-11-11 DIAGNOSIS — G4733 Obstructive sleep apnea (adult) (pediatric): Secondary | ICD-10-CM | POA: Diagnosis not present

## 2023-11-13 DIAGNOSIS — Z7901 Long term (current) use of anticoagulants: Secondary | ICD-10-CM | POA: Diagnosis not present

## 2023-11-13 DIAGNOSIS — I482 Chronic atrial fibrillation, unspecified: Secondary | ICD-10-CM | POA: Diagnosis not present

## 2023-12-11 DIAGNOSIS — Z7901 Long term (current) use of anticoagulants: Secondary | ICD-10-CM | POA: Diagnosis not present

## 2023-12-11 DIAGNOSIS — I482 Chronic atrial fibrillation, unspecified: Secondary | ICD-10-CM | POA: Diagnosis not present

## 2023-12-25 DIAGNOSIS — I482 Chronic atrial fibrillation, unspecified: Secondary | ICD-10-CM | POA: Diagnosis not present

## 2023-12-25 DIAGNOSIS — E538 Deficiency of other specified B group vitamins: Secondary | ICD-10-CM | POA: Diagnosis not present

## 2023-12-25 DIAGNOSIS — E785 Hyperlipidemia, unspecified: Secondary | ICD-10-CM | POA: Diagnosis not present

## 2023-12-25 DIAGNOSIS — Z79891 Long term (current) use of opiate analgesic: Secondary | ICD-10-CM | POA: Diagnosis not present

## 2023-12-25 DIAGNOSIS — I10 Essential (primary) hypertension: Secondary | ICD-10-CM | POA: Diagnosis not present

## 2023-12-26 DIAGNOSIS — E538 Deficiency of other specified B group vitamins: Secondary | ICD-10-CM | POA: Diagnosis not present

## 2023-12-26 DIAGNOSIS — I482 Chronic atrial fibrillation, unspecified: Secondary | ICD-10-CM | POA: Diagnosis not present

## 2023-12-26 DIAGNOSIS — E785 Hyperlipidemia, unspecified: Secondary | ICD-10-CM | POA: Diagnosis not present

## 2023-12-26 DIAGNOSIS — I10 Essential (primary) hypertension: Secondary | ICD-10-CM | POA: Diagnosis not present

## 2023-12-30 ENCOUNTER — Ambulatory Visit (INDEPENDENT_AMBULATORY_CARE_PROVIDER_SITE_OTHER): Payer: PPO

## 2023-12-30 ENCOUNTER — Encounter (INDEPENDENT_AMBULATORY_CARE_PROVIDER_SITE_OTHER): Payer: Self-pay | Admitting: Vascular Surgery

## 2023-12-30 ENCOUNTER — Ambulatory Visit (INDEPENDENT_AMBULATORY_CARE_PROVIDER_SITE_OTHER): Payer: PPO | Admitting: Vascular Surgery

## 2023-12-30 VITALS — BP 115/78 | HR 93 | Resp 18 | Ht 69.0 in | Wt 263.2 lb

## 2023-12-30 DIAGNOSIS — I10 Essential (primary) hypertension: Secondary | ICD-10-CM

## 2023-12-30 DIAGNOSIS — E785 Hyperlipidemia, unspecified: Secondary | ICD-10-CM

## 2023-12-30 DIAGNOSIS — I7143 Infrarenal abdominal aortic aneurysm, without rupture: Secondary | ICD-10-CM

## 2023-12-30 DIAGNOSIS — I482 Chronic atrial fibrillation, unspecified: Secondary | ICD-10-CM | POA: Diagnosis not present

## 2023-12-30 NOTE — Assessment & Plan Note (Signed)
 Duplex today does show increase in his sac diameter now measuring 5.67 cm in maximal diameter.  Stent graft is patent and an endoleak is not obvious by duplex. Given the significant sac growth, I would like to shorten his interval follow-up and see him back in 3 to 4 months with a duplex.  I discussed if he continues to have sac growth, we will need to consider repeat angiogram and likely treatment of type II endoleak with endovascular coils.

## 2023-12-30 NOTE — Progress Notes (Signed)
 MRN : [id number]  Richard Wilkins is a 89 y.o. ([birth year]) male who presents with chief complaint of  Chief Complaint  Patient presents with   Follow-up    6 month follow EVAR  .  History of Present Illness: Patient returns today in follow up of his abdominal aortic aneurysm.  He is status post endovascular repair in the past and underwent coil embolization of the type II endoleak in June 2023.  For that time, he had had significant aneurysm sac growth.  It regressed down to below 5 cm.  He has no current aneurysm related symptoms. Specifically, the patient denies new back or abdominal pain, or signs of peripheral embolization.  Duplex today does show increase in his sac diameter now measuring 5.67 cm in maximal diameter.  Stent graft is patent and an endoleak is not obvious by duplex.  Current Outpatient Medications  Medication Sig Dispense Refill   acetaminophen (TYLENOL) 500 MG tablet Take 500-1,000 mg by mouth every 6 (six) hours as needed (for pain.).      alfuzosin (UROXATRAL) 10 MG 24 hr tablet Take 1 tablet (10 mg total) by mouth daily with breakfast.     aspirin EC 81 MG tablet Take 81 mg by mouth daily.     diltiazem (CARDIZEM CD) 180 MG 24 hr capsule TAKE ONE CAPSULE BY MOUTH ONCE DAILY     diphenhydramine-acetaminophen (TYLENOL PM) 25-500 MG TABS tablet Take 1 tablet by mouth at bedtime as needed.     fluticasone (FLONASE) 50 MCG/ACT nasal spray Place 1 spray into both nostrils daily as needed (for allergies.).      guaiFENesin-dextromethorphan (ROBITUSSIN DM) 100-10 MG/5ML syrup Take 5 mLs by mouth at bedtime as needed for cough.     HYDROcodone-acetaminophen (NORCO/VICODIN) 5-325 MG tablet Take 1 tablet by mouth every 6 (six) hours as needed (for lower back pain.). 20 tablet 0   ketoconazole (NIZORAL) 2 % cream Apply topically 2 (two) times daily as needed.     meclizine (ANTIVERT) 12.5 MG tablet TAKE 1 TO 2 TABLETS BY MOUTH THREE TIMES DAILY AS NEEDED FOR MOTION SICKNESS      Misc Natural Products (COLON CLEANSE PO) Take by mouth.     simvastatin (ZOCOR) 40 MG tablet TAKE ONE TABLET BY MOUTH AT BEDTIME     traZODone (DESYREL) 50 MG tablet Take 1 tablet by mouth at bedtime as needed.     warfarin (COUMADIN) 5 MG tablet Take 5 mg by mouth at bedtime.      No current facility-administered medications for this visit.    Past Medical History:  Diagnosis Date   Abdominal aortic aneurysm (AAA) (HCC)    Arthritis    Atrial fibrillation (HCC)    Cancer (HCC)    Chronic back pain    Colon polyp    Constipation    Coronary artery disease    Elevated lipids    Hemorrhage    Hyperlipidemia    Hypertension    Localized swelling of both lower legs    Myocardial infarction (HCC)    2004 x 2 stents   Sleep apnea    CPAP    Past Surgical History:  Procedure Laterality Date   CARDIAC SURGERY     CATARACT EXTRACTION W/PHACO Right 12/27/2019   Procedure: CATARACT EXTRACTION PHACO AND INTRAOCULAR LENS PLACEMENT ([facility]) RIGHT;  Surgeon: Nevada Crane, MD;  Location: [facility];  Service: Ophthalmology;  Laterality: Right;  CDE 9.84 [place] 1:03.2   HEMORRHOID SURGERY  IRRIGATION AND DEBRIDEMENT HEMATOMA     LOWER EXTREMITY ANGIOGRAPHY N/A 04/18/2022   Procedure: Lower Extremity Angiography;  Surgeon: Annice Needy, MD;  Location: [facility];  Service: Cardiovascular;  Laterality: N/A;   PERIPHERAL VASCULAR CATHETERIZATION N/A 09/25/2016   Procedure: Endovascular Repair/Stent Graft;  Surgeon: Annice Needy, MD;  Location: [facility];  Service: Cardiovascular;  Laterality: N/A;     Social History   Tobacco Use   Smoking status: Former    Current packs/day: 0.00    Types: Cigarettes    Quit date: 11/11/2002    Years since quitting: 21.1   Smokeless tobacco: Never  Vaping Use   Vaping status: Never Used  Substance Use Topics   Alcohol use: No   Drug use: No      Family History  Problem Relation Age of Onset   Cancer - Other  Brother    Prostate cancer Neg Hx    Bladder Cancer Neg Hx    Kidney cancer Neg Hx      No Known Allergies  REVIEW OF SYSTEMS (Negative unless checked)   Constitutional: [] Weight loss  [] Fever  [] Chills Cardiac: [] Chest pain   [] Chest pressure   [x] Palpitations   [] Shortness of breath when laying flat   [] Shortness of breath at rest   [x] Shortness of breath with exertion. Vascular:  [] Pain in legs with walking   [] Pain in legs at rest   [] Pain in legs when laying flat   [] Claudication   [] Pain in feet when walking  [] Pain in feet at rest  [] Pain in feet when laying flat   [] History of DVT   [] Phlebitis   [x] Swelling in legs   [] Varicose veins   [] Non-healing ulcers Pulmonary:   [] Uses home oxygen   [] Productive cough   [] Hemoptysis   [] Wheeze  [] COPD   [] Asthma Neurologic:  [] Dizziness  [] Blackouts   [] Seizures   [] History of stroke   [] History of TIA  [] Aphasia   [] Temporary blindness   [] Dysphagia   [] Weakness or numbness in arms   [] Weakness or numbness in legs Musculoskeletal:  [x] Arthritis   [] Joint swelling   [x] Joint pain   [] Low back pain Hematologic:  [] Easy bruising  [] Easy bleeding   [] Hypercoagulable state   [] Anemic   Gastrointestinal:  [] Blood in stool   [] Vomiting blood  [x] Gastroesophageal reflux/heartburn   [] Abdominal pain Genitourinary:  [] Chronic kidney disease   [] Difficult urination  [] Frequent urination  [] Burning with urination   [] Hematuria Skin:  [] Rashes   [] Ulcers   [] Wounds Psychological:  [] History of anxiety   []  History of major depression.  Physical Examination  BP 115/78   Pulse 93   Resp 18   Ht 5\' 9"  (1.753 m)   Wt 263 lb 3.2 oz (119.4 kg)   BMI 38.87 kg/m  Gen:  WD/WN, NAD Head: [place]/AT, No temporalis wasting. Ear/Nose/Throat: Hearing grossly intact, nares w/o erythema or drainage Eyes: Conjunctiva clear. Sclera non-icteric Neck: Supple.  Trachea midline Pulmonary:  Good air movement, no use of accessory muscles.  Cardiac: RRR, no  JVD Vascular:  Vessel Right Left  Radial Palpable Palpable                                   Gastrointestinal: soft, non-tender/non-distended. No guarding/reflex.  Musculoskeletal: M/S 5/5 throughout.  No deformity or atrophy. Mild LE edema. Neurologic: Sensation grossly intact in extremities.  Symmetrical.  Speech is  fluent.  Psychiatric: Judgment intact, Mood & affect appropriate for pt's clinical situation. Dermatologic: No rashes or ulcers noted.  No cellulitis or open wounds.      Labs No results found for this or any previous visit (from the past 2160 hours).  Radiology No results found.  Assessment/Plan  AAA (abdominal aortic aneurysm) without rupture (HCC)  Duplex today does show increase in his sac diameter now measuring 5.67 cm in maximal diameter.  Stent graft is patent and an endoleak is not obvious by duplex. Given the significant sac growth, I would like to shorten his interval follow-up and see him back in 3 to 4 months with a duplex.  I discussed if he continues to have sac growth, we will need to consider repeat angiogram and likely treatment of type II endoleak with endovascular coils.  Atrial fibrillation, chronic (HCC) On anticoagulation which seems to make type II endoleaks more likely   Hypertension blood pressure control important in reducing the progression of atherosclerotic disease and aneurysmal growth. On appropriate oral medications.     Hyperlipidemia lipid control important in reducing the progression of atherosclerotic disease. Continue statin therapy  Festus Barren, MD  12/30/2023 12:31 PM    This note was created with Dragon medical transcription system.  Any errors from dictation are purely unintentional

## 2024-01-05 DIAGNOSIS — Z131 Encounter for screening for diabetes mellitus: Secondary | ICD-10-CM | POA: Diagnosis not present

## 2024-01-05 DIAGNOSIS — I714 Abdominal aortic aneurysm, without rupture, unspecified: Secondary | ICD-10-CM | POA: Diagnosis not present

## 2024-01-05 DIAGNOSIS — E785 Hyperlipidemia, unspecified: Secondary | ICD-10-CM | POA: Diagnosis not present

## 2024-01-05 DIAGNOSIS — I10 Essential (primary) hypertension: Secondary | ICD-10-CM | POA: Diagnosis not present

## 2024-01-05 DIAGNOSIS — D6869 Other thrombophilia: Secondary | ICD-10-CM | POA: Diagnosis not present

## 2024-01-05 DIAGNOSIS — I482 Chronic atrial fibrillation, unspecified: Secondary | ICD-10-CM | POA: Diagnosis not present

## 2024-01-05 DIAGNOSIS — G4730 Sleep apnea, unspecified: Secondary | ICD-10-CM | POA: Diagnosis not present

## 2024-01-05 DIAGNOSIS — I739 Peripheral vascular disease, unspecified: Secondary | ICD-10-CM | POA: Diagnosis not present

## 2024-01-05 DIAGNOSIS — D692 Other nonthrombocytopenic purpura: Secondary | ICD-10-CM | POA: Diagnosis not present

## 2024-01-05 DIAGNOSIS — Z79891 Long term (current) use of opiate analgesic: Secondary | ICD-10-CM | POA: Diagnosis not present

## 2024-01-22 DIAGNOSIS — I482 Chronic atrial fibrillation, unspecified: Secondary | ICD-10-CM | POA: Diagnosis not present

## 2024-01-22 DIAGNOSIS — Z7901 Long term (current) use of anticoagulants: Secondary | ICD-10-CM | POA: Diagnosis not present

## 2024-02-26 DIAGNOSIS — I10 Essential (primary) hypertension: Secondary | ICD-10-CM | POA: Diagnosis not present

## 2024-02-26 DIAGNOSIS — I482 Chronic atrial fibrillation, unspecified: Secondary | ICD-10-CM | POA: Diagnosis not present

## 2024-02-26 DIAGNOSIS — E785 Hyperlipidemia, unspecified: Secondary | ICD-10-CM | POA: Diagnosis not present

## 2024-02-26 DIAGNOSIS — Z9889 Other specified postprocedural states: Secondary | ICD-10-CM | POA: Diagnosis not present

## 2024-02-26 DIAGNOSIS — I2510 Atherosclerotic heart disease of native coronary artery without angina pectoris: Secondary | ICD-10-CM | POA: Diagnosis not present

## 2024-02-26 DIAGNOSIS — Z7901 Long term (current) use of anticoagulants: Secondary | ICD-10-CM | POA: Diagnosis not present

## 2024-03-03 DIAGNOSIS — G4733 Obstructive sleep apnea (adult) (pediatric): Secondary | ICD-10-CM | POA: Diagnosis not present

## 2024-03-25 DIAGNOSIS — Z7901 Long term (current) use of anticoagulants: Secondary | ICD-10-CM | POA: Diagnosis not present

## 2024-03-25 DIAGNOSIS — I482 Chronic atrial fibrillation, unspecified: Secondary | ICD-10-CM | POA: Diagnosis not present

## 2024-03-29 DIAGNOSIS — Z85828 Personal history of other malignant neoplasm of skin: Secondary | ICD-10-CM | POA: Diagnosis not present

## 2024-03-29 DIAGNOSIS — D2261 Melanocytic nevi of right upper limb, including shoulder: Secondary | ICD-10-CM | POA: Diagnosis not present

## 2024-03-29 DIAGNOSIS — D485 Neoplasm of uncertain behavior of skin: Secondary | ICD-10-CM | POA: Diagnosis not present

## 2024-03-29 DIAGNOSIS — D2272 Melanocytic nevi of left lower limb, including hip: Secondary | ICD-10-CM | POA: Diagnosis not present

## 2024-03-29 DIAGNOSIS — B078 Other viral warts: Secondary | ICD-10-CM | POA: Diagnosis not present

## 2024-03-29 DIAGNOSIS — C44319 Basal cell carcinoma of skin of other parts of face: Secondary | ICD-10-CM | POA: Diagnosis not present

## 2024-03-29 DIAGNOSIS — D2262 Melanocytic nevi of left upper limb, including shoulder: Secondary | ICD-10-CM | POA: Diagnosis not present

## 2024-03-29 DIAGNOSIS — L570 Actinic keratosis: Secondary | ICD-10-CM | POA: Diagnosis not present

## 2024-03-29 DIAGNOSIS — D225 Melanocytic nevi of trunk: Secondary | ICD-10-CM | POA: Diagnosis not present

## 2024-04-02 ENCOUNTER — Encounter (INDEPENDENT_AMBULATORY_CARE_PROVIDER_SITE_OTHER): Payer: Self-pay | Admitting: Vascular Surgery

## 2024-04-02 ENCOUNTER — Ambulatory Visit (INDEPENDENT_AMBULATORY_CARE_PROVIDER_SITE_OTHER): Payer: PPO

## 2024-04-02 ENCOUNTER — Ambulatory Visit (INDEPENDENT_AMBULATORY_CARE_PROVIDER_SITE_OTHER): Payer: PPO | Admitting: Vascular Surgery

## 2024-04-02 VITALS — BP 136/81 | HR 74 | Resp 18 | Ht 69.0 in | Wt 263.0 lb

## 2024-04-02 DIAGNOSIS — I10 Essential (primary) hypertension: Secondary | ICD-10-CM

## 2024-04-02 DIAGNOSIS — I7143 Infrarenal abdominal aortic aneurysm, without rupture: Secondary | ICD-10-CM

## 2024-04-02 DIAGNOSIS — E785 Hyperlipidemia, unspecified: Secondary | ICD-10-CM

## 2024-04-02 DIAGNOSIS — I4891 Unspecified atrial fibrillation: Secondary | ICD-10-CM

## 2024-04-02 NOTE — Progress Notes (Signed)
 MRN : [id number]  Richard Wilkins is a 89 y.o. (Jun 29, 1935) male who presents with chief complaint of  Chief Complaint  Patient presents with   Follow-up    3-4 month EVAR  .  History of Present Illness: Patient returns today in follow up of his abdominal aortic aneurysm.  He has had a previous stent graft repair and about 2 years ago, he underwent coil embolization for branches creating a type II endoleak.  He has no current aneurysm related symptoms. Specifically, the patient denies new back or abdominal pain, or signs of peripheral embolization.  His aortic duplex today does demonstrate a type II endoleak with a significant growth of his abdominal aortic sac now measuring 6.7 cm in maximal diameter.  This is roughly 1 cm larger than it was earlier this year.  Current Outpatient Medications  Medication Sig Dispense Refill   acetaminophen  (TYLENOL ) 500 MG tablet Take 500-1,000 mg by mouth every 6 (six) hours as needed (for pain.).      alfuzosin  (UROXATRAL ) 10 MG 24 hr tablet Take 1 tablet (10 mg total) by mouth daily with breakfast.     aspirin  EC 81 MG tablet Take 81 mg by mouth daily.     diltiazem  (CARDIZEM  CD) 180 MG 24 hr capsule TAKE ONE CAPSULE BY MOUTH ONCE DAILY     diphenhydramine -acetaminophen  (TYLENOL  PM) 25-500 MG TABS tablet Take 1 tablet by mouth at bedtime as needed.     fluticasone  (FLONASE ) 50 MCG/ACT nasal spray Place 1 spray into both nostrils daily as needed (for allergies.).      guaiFENesin -dextromethorphan  (ROBITUSSIN DM) 100-10 MG/5ML syrup Take 5 mLs by mouth at bedtime as needed for cough.     HYDROcodone -acetaminophen  (NORCO/VICODIN) 5-325 MG tablet Take 1 tablet by mouth every 6 (six) hours as needed (for lower back pain.). 20 tablet 0   ketoconazole (NIZORAL) 2 % cream Apply topically 2 (two) times daily as needed.     meclizine (ANTIVERT) 12.5 MG tablet TAKE 1 TO 2 TABLETS BY MOUTH THREE TIMES DAILY AS NEEDED FOR MOTION SICKNESS     Misc Natural Products  (COLON CLEANSE PO) Take by mouth.     simvastatin  (ZOCOR ) 40 MG tablet TAKE ONE TABLET BY MOUTH AT BEDTIME     traZODone (DESYREL) 50 MG tablet Take 1 tablet by mouth at bedtime as needed.     Vitamin E (VITAMIN E/D-ALPHA NATURAL) 268 MG (400 UNIT) CAPS Take 400 Units by mouth.     warfarin (COUMADIN ) 5 MG tablet Take 5 mg by mouth at bedtime.      No current facility-administered medications for this visit.    Past Medical History:  Diagnosis Date   Abdominal aortic aneurysm (AAA) (HCC)    Arthritis    Atrial fibrillation (HCC)    Cancer (HCC)    Chronic back pain    Colon polyp    Constipation    Coronary artery disease    Elevated lipids    Hemorrhage    Hyperlipidemia    Hypertension    Localized swelling of both lower legs    Myocardial infarction (HCC)    2004 x 2 stents   Sleep apnea    CPAP    Past Surgical History:  Procedure Laterality Date   CARDIAC SURGERY     CATARACT EXTRACTION W/PHACO Right 12/27/2019   Procedure: CATARACT EXTRACTION PHACO AND INTRAOCULAR LENS PLACEMENT (IOC) RIGHT;  Surgeon: Rosa College, MD;  Location: [facility];  Service: Ophthalmology;  Laterality: Right;  CDE 9.84 US  1:03.2   HEMORRHOID SURGERY     IRRIGATION AND DEBRIDEMENT HEMATOMA     LOWER EXTREMITY ANGIOGRAPHY N/A 04/18/2022   Procedure: Lower Extremity Angiography;  Surgeon: Celso College, MD;  Location: [facility];  Service: Cardiovascular;  Laterality: N/A;   PERIPHERAL VASCULAR CATHETERIZATION N/A 09/25/2016   Procedure: Endovascular Repair/Stent Graft;  Surgeon: Celso College, MD;  Location: [facility];  Service: Cardiovascular;  Laterality: N/A;     Social History   Tobacco Use   Smoking status: Former    Current packs/day: 0.00    Types: Cigarettes    Quit date: 11/11/2002    Years since quitting: 21.4   Smokeless tobacco: Never  Vaping Use   Vaping status: Never Used  Substance Use Topics   Alcohol  use: No   Drug use: No       Family History  Problem Relation Age of Onset   Cancer - Other Brother    Prostate cancer Neg Hx    Bladder Cancer Neg Hx    Kidney cancer Neg Hx      No Known Allergies   REVIEW OF SYSTEMS (Negative unless checked)   Constitutional: [] Weight loss  [] Fever  [] Chills Cardiac: [] Chest pain   [] Chest pressure   [x] Palpitations   [] Shortness of breath when laying flat   [] Shortness of breath at rest   [x] Shortness of breath with exertion. Vascular:  [] Pain in legs with walking   [] Pain in legs at rest   [] Pain in legs when laying flat   [] Claudication   [] Pain in feet when walking  [] Pain in feet at rest  [] Pain in feet when laying flat   [] History of DVT   [] Phlebitis   [x] Swelling in legs   [] Varicose veins   [] Non-healing ulcers Pulmonary:   [] Uses home oxygen   [] Productive cough   [] Hemoptysis   [] Wheeze  [] COPD   [] Asthma Neurologic:  [] Dizziness  [] Blackouts   [] Seizures   [] History of stroke   [] History of TIA  [] Aphasia   [] Temporary blindness   [] Dysphagia   [] Weakness or numbness in arms   [] Weakness or numbness in legs Musculoskeletal:  [x] Arthritis   [] Joint swelling   [x] Joint pain   [] Low back pain Hematologic:  [] Easy bruising  [] Easy bleeding   [] Hypercoagulable state   [] Anemic   Gastrointestinal:  [] Blood in stool   [] Vomiting blood  [x] Gastroesophageal reflux/heartburn   [] Abdominal pain Genitourinary:  [] Chronic kidney disease   [] Difficult urination  [] Frequent urination  [] Burning with urination   [] Hematuria Skin:  [] Rashes   [] Ulcers   [] Wounds Psychological:  [] History of anxiety   []  History of major depression.  Physical Examination  BP 136/81   Pulse 74   Resp 18   Ht 5\' 9"  (1.753 m)   Wt 263 lb (119.3 kg)   BMI 38.84 kg/m  Gen:  WD/WN, NAD Head: [place]/AT, No temporalis wasting. Ear/Nose/Throat: Hearing grossly intact, nares w/o erythema or drainage Eyes: Conjunctiva clear. Sclera non-icteric Neck: Supple.  Trachea midline Pulmonary:  Good air  movement, no use of accessory muscles.  Cardiac: RRR, no JVD Vascular:  Vessel Right Left  Radial Palpable Palpable                                   Gastrointestinal: soft, non-tender/non-distended. No guarding/reflex. Aorta not easily palpable  Musculoskeletal: M/S 5/5 throughout.  No deformity or atrophy. Mild LE edema. Neurologic: Sensation grossly intact in extremities.  Symmetrical.  Speech is fluent.  Psychiatric: Judgment intact, Mood & affect appropriate for pt's clinical situation. Dermatologic: No rashes or ulcers noted.  No cellulitis or open wounds.      Labs No results found for this or any previous visit (from the past 2160 hours).  Radiology No results found.  Assessment/Plan  AAA (abdominal aortic aneurysm) without rupture (HCC) His aortic duplex today does demonstrate a type II endoleak with a significant growth of his abdominal aortic sac now measuring 6.7 cm in maximal diameter.  This is roughly 1 cm larger than it was earlier this year. Given this finding, I do not think we can continue to monitor his aneurysm with visible growth roughly a centimeter.  Even though it is a type II endoleak, he clearly seems to be causing sac growth.  I would recommend an angiogram with planned coil embolization of any feeding branches.  I have discussed the procedure including its risks and benefits.  I have discussed the reason and rationale for proceeding.  Atrial fibrillation, chronic (HCC) On anticoagulation which seems to make type II endoleaks more likely   Hypertension blood pressure control important in reducing the progression of atherosclerotic disease and aneurysmal growth. On appropriate oral medications.     Hyperlipidemia lipid control important in reducing the progression of atherosclerotic disease. Continue statin therapy  Mikki Alexander, MD  04/02/2024 1:14 PM    This note was created with Dragon medical transcription system.  Any errors from  dictation are purely unintentional

## 2024-04-02 NOTE — Assessment & Plan Note (Signed)
 His aortic duplex today does demonstrate a type II endoleak with a significant growth of his abdominal aortic sac now measuring 6.7 cm in maximal diameter.  This is roughly 1 cm larger than it was earlier this year. Given this finding, I do not think we can continue to monitor his aneurysm with visible growth roughly a centimeter.  Even though it is a type II endoleak, he clearly seems to be causing sac growth.  I would recommend an angiogram with planned coil embolization of any feeding branches.  I have discussed the procedure including its risks and benefits.  I have discussed the reason and rationale for proceeding.

## 2024-04-02 NOTE — Patient Instructions (Signed)
Abdominal Aortic Aneurysm  An aneurysm is a bulge in an artery. It happens when blood pushes against a weak or damaged artery wall. An abdominal aortic aneurysm (AAA) is an aneurysm in the lower part of the aorta. The aorta is the main artery of the body. It supplies blood from the heart to the rest of the body. Most aneurysms do not cause symptoms. Some can cause problems. An AAA can cause two serious problems: It can grow and then burst (rupture). It can cause blood to flow between the layers of the wall of the aorta through a tear (aortic dissection). These problems are medical emergencies. They can cause bleeding inside the body. They should be treated right away. What are the causes? The exact cause of this condition is not known. What increases the risk? You may be more likely to develop an AAA if: You are male and 31 years of age or older. You are Caucasian. You use or have used nicotine or tobacco products. You have a family history of aneurysms. You have an injury or trauma to your aorta. You are obese. You have: Arteriosclerosis. This is when your arteries harden. Arteritis. This is inflammation of the walls of an artery. Certain genetic conditions. Infectious aortitis. This is an infection in the wall of your aorta caused by bacteria. High cholesterol. High blood pressure (hypertension). What are the signs or symptoms? Symptoms depend on how big the aneurysm is and how fast it is growing. Most grow slowly and do not cause symptoms. In some cases, you may have: Severe pain in your abdomen, side, or lower back. A feeling of fullness after you eat only a little bit of food. A throbbing lump in your abdomen. Painful feet or toes. You may also have discolored skin or sores on your feet or toes. Constipation or trouble peeing (urinating). If your AAA bursts, you may: Feel sudden, severe pain in your abdomen, side, or back. Have nausea or vomiting. Feel light-headed or  faint. How is this diagnosed? This condition may be diagnosed with a physical exam to check for throbbing and listen to blood flow in your abdomen. You may also have tests, such as: Ultrasound. X-rays. CT scan. MRI. Angiogram. This test checks your arteries for damage or blockage. Because most AAAs that have not burst do not cause symptoms, they are often found during exams for other conditions. How is this treated? Treatment depends on: The size of your aneurysm. How fast your aneurysm is growing. Your age. Risk factors for a burst AAA. If your aneurysm is smaller than 2 inches (5 cm), your health care provider may: Keep an eye on it to see if it gets bigger. You may need to have an ultrasound every 6-12 months, every year, or every few years. Give you medicines to control blood pressure, treat pain, or fight infection. If your aneurysm is larger than 2 inches (5 cm), you may need surgery. Follow these instructions at home: Eating and drinking  Eat a heart-healthy diet. This includes lots of fresh fruits and vegetables, whole grains, low-fat (lean) protein, and low-fat dairy products. Avoid foods that are high in saturated fat and cholesterol. These include red meat and some dairy products. Lifestyle  Do not use any products that contain nicotine or tobacco. These products include cigarettes, chewing tobacco, and vaping devices, such as e-cigarettes. If you need help quitting, ask your provider. Check your blood pressure often. Follow instructions on how to keep it within normal limits. Have your  cholesterol levels checked often. Follow instructions on how to keep levels within normal limits. Stay active. Exercise on a regular basis. Talk with your provider about how often to exercise and which types of exercise are safe for you. Maintain a healthy weight. Alcohol use Do not drink alcohol if: Your provider tells you not to drink. You are pregnant, may be pregnant, or plan to  become pregnant. If you drink alcohol: Limit how much you have to: 0-1 drink a day if you are male. 0-2 drinks a day if you are male. Know how much alcohol is in your drink. In the [place] one drink is one 12 oz bottle of beer (355 mL), one 5 oz glass of wine (148 mL), or one 1 oz glass of hard liquor (44 mL). General instructions Take over-the-counter and prescription medicines only as told by your provider. You may have to avoid lifting. Ask your provider how much you can safely lift. If you can, learn your family's health history. Keep all follow-up visits. Your provider will need to watch the size of your aneurysm and how fast it is growing. Where to find more information American Heart Association: heart.org Contact a health care provider if: You have pain in your abdomen, side, or back. You have a throbbing mass in your abdomen. Your heart beats fast when you stand. You have nausea or vomiting. You are constipated or have trouble peeing. You have a fever. Get help right away if: You have sudden, severe pain in your abdomen, side, or back. You feel light-headed, or you faint. You have sweaty, clammy skin. You are short of breath. These symptoms may be an emergency. Get help right away. Call 911. Do not wait to see if the symptoms will go away. Do not drive yourself to the hospital. This information is not intended to replace advice given to you by your health care provider. Make sure you discuss any questions you have with your health care provider. Document Revised: 06/04/2022 Document Reviewed: 06/04/2022 Elsevier Patient Education  2024 [employer].

## 2024-04-08 DIAGNOSIS — I482 Chronic atrial fibrillation, unspecified: Secondary | ICD-10-CM | POA: Diagnosis not present

## 2024-04-08 DIAGNOSIS — Z7901 Long term (current) use of anticoagulants: Secondary | ICD-10-CM | POA: Diagnosis not present

## 2024-04-22 DIAGNOSIS — I482 Chronic atrial fibrillation, unspecified: Secondary | ICD-10-CM | POA: Diagnosis not present

## 2024-04-22 DIAGNOSIS — Z7901 Long term (current) use of anticoagulants: Secondary | ICD-10-CM | POA: Diagnosis not present

## 2024-05-04 DIAGNOSIS — C44319 Basal cell carcinoma of skin of other parts of face: Secondary | ICD-10-CM | POA: Diagnosis not present

## 2024-05-20 DIAGNOSIS — Z7901 Long term (current) use of anticoagulants: Secondary | ICD-10-CM | POA: Diagnosis not present

## 2024-05-20 DIAGNOSIS — I482 Chronic atrial fibrillation, unspecified: Secondary | ICD-10-CM | POA: Diagnosis not present

## 2024-05-24 DIAGNOSIS — G4733 Obstructive sleep apnea (adult) (pediatric): Secondary | ICD-10-CM | POA: Diagnosis not present

## 2024-06-17 DIAGNOSIS — I482 Chronic atrial fibrillation, unspecified: Secondary | ICD-10-CM | POA: Diagnosis not present

## 2024-06-28 DIAGNOSIS — Z7901 Long term (current) use of anticoagulants: Secondary | ICD-10-CM | POA: Diagnosis not present

## 2024-06-28 DIAGNOSIS — Z131 Encounter for screening for diabetes mellitus: Secondary | ICD-10-CM | POA: Diagnosis not present

## 2024-06-28 DIAGNOSIS — I10 Essential (primary) hypertension: Secondary | ICD-10-CM | POA: Diagnosis not present

## 2024-06-28 DIAGNOSIS — E785 Hyperlipidemia, unspecified: Secondary | ICD-10-CM | POA: Diagnosis not present

## 2024-06-28 DIAGNOSIS — Z79891 Long term (current) use of opiate analgesic: Secondary | ICD-10-CM | POA: Diagnosis not present

## 2024-06-28 DIAGNOSIS — I482 Chronic atrial fibrillation, unspecified: Secondary | ICD-10-CM | POA: Diagnosis not present

## 2024-07-05 DIAGNOSIS — I2510 Atherosclerotic heart disease of native coronary artery without angina pectoris: Secondary | ICD-10-CM | POA: Diagnosis not present

## 2024-07-05 DIAGNOSIS — D6869 Other thrombophilia: Secondary | ICD-10-CM | POA: Diagnosis not present

## 2024-07-05 DIAGNOSIS — D692 Other nonthrombocytopenic purpura: Secondary | ICD-10-CM | POA: Diagnosis not present

## 2024-07-05 DIAGNOSIS — G4730 Sleep apnea, unspecified: Secondary | ICD-10-CM | POA: Diagnosis not present

## 2024-07-05 DIAGNOSIS — Z0000 Encounter for general adult medical examination without abnormal findings: Secondary | ICD-10-CM | POA: Diagnosis not present

## 2024-07-05 DIAGNOSIS — I482 Chronic atrial fibrillation, unspecified: Secondary | ICD-10-CM | POA: Diagnosis not present

## 2024-07-05 DIAGNOSIS — E538 Deficiency of other specified B group vitamins: Secondary | ICD-10-CM | POA: Diagnosis not present

## 2024-07-05 DIAGNOSIS — Z1331 Encounter for screening for depression: Secondary | ICD-10-CM | POA: Diagnosis not present

## 2024-07-05 DIAGNOSIS — I10 Essential (primary) hypertension: Secondary | ICD-10-CM | POA: Diagnosis not present

## 2024-07-05 DIAGNOSIS — R1084 Generalized abdominal pain: Secondary | ICD-10-CM | POA: Diagnosis not present

## 2024-07-05 DIAGNOSIS — I714 Abdominal aortic aneurysm, without rupture, unspecified: Secondary | ICD-10-CM | POA: Diagnosis not present

## 2024-07-05 DIAGNOSIS — E785 Hyperlipidemia, unspecified: Secondary | ICD-10-CM | POA: Diagnosis not present

## 2024-07-05 DIAGNOSIS — I739 Peripheral vascular disease, unspecified: Secondary | ICD-10-CM | POA: Diagnosis not present

## 2024-07-15 DIAGNOSIS — I482 Chronic atrial fibrillation, unspecified: Secondary | ICD-10-CM | POA: Diagnosis not present

## 2024-07-15 DIAGNOSIS — Z131 Encounter for screening for diabetes mellitus: Secondary | ICD-10-CM | POA: Diagnosis not present

## 2024-07-15 DIAGNOSIS — J90 Pleural effusion, not elsewhere classified: Secondary | ICD-10-CM | POA: Diagnosis not present

## 2024-07-15 DIAGNOSIS — D6869 Other thrombophilia: Secondary | ICD-10-CM | POA: Diagnosis not present

## 2024-07-15 DIAGNOSIS — I739 Peripheral vascular disease, unspecified: Secondary | ICD-10-CM | POA: Diagnosis not present

## 2024-07-15 DIAGNOSIS — I714 Abdominal aortic aneurysm, without rupture, unspecified: Secondary | ICD-10-CM | POA: Diagnosis not present

## 2024-07-15 DIAGNOSIS — I10 Essential (primary) hypertension: Secondary | ICD-10-CM | POA: Diagnosis not present

## 2024-07-15 DIAGNOSIS — I4891 Unspecified atrial fibrillation: Secondary | ICD-10-CM | POA: Diagnosis not present

## 2024-07-15 DIAGNOSIS — E538 Deficiency of other specified B group vitamins: Secondary | ICD-10-CM | POA: Diagnosis not present

## 2024-07-15 DIAGNOSIS — E785 Hyperlipidemia, unspecified: Secondary | ICD-10-CM | POA: Diagnosis not present

## 2024-07-15 DIAGNOSIS — D692 Other nonthrombocytopenic purpura: Secondary | ICD-10-CM | POA: Diagnosis not present

## 2024-07-29 DIAGNOSIS — I482 Chronic atrial fibrillation, unspecified: Secondary | ICD-10-CM | POA: Diagnosis not present

## 2024-08-09 ENCOUNTER — Telehealth (INDEPENDENT_AMBULATORY_CARE_PROVIDER_SITE_OTHER): Payer: Self-pay

## 2024-08-09 NOTE — Telephone Encounter (Signed)
 Spoke with the patient earlier to schedule him for a endoleak angio. After going through the pre-procedure instructions the patient states he has fluid on his lungs per his cardiologist. I spoke with Dr. Marea and he states to hold off for a week. I called the patient back and he is seeing his cardiologist on 08/26/24 and will call back to schedule his procedure after that.

## 2024-08-19 DIAGNOSIS — J90 Pleural effusion, not elsewhere classified: Secondary | ICD-10-CM | POA: Diagnosis not present

## 2024-08-19 DIAGNOSIS — I2510 Atherosclerotic heart disease of native coronary artery without angina pectoris: Secondary | ICD-10-CM | POA: Diagnosis not present

## 2024-08-19 DIAGNOSIS — I739 Peripheral vascular disease, unspecified: Secondary | ICD-10-CM | POA: Diagnosis not present

## 2024-08-19 DIAGNOSIS — I714 Abdominal aortic aneurysm, without rupture, unspecified: Secondary | ICD-10-CM | POA: Diagnosis not present

## 2024-08-19 DIAGNOSIS — I10 Essential (primary) hypertension: Secondary | ICD-10-CM | POA: Diagnosis not present

## 2024-08-19 DIAGNOSIS — E785 Hyperlipidemia, unspecified: Secondary | ICD-10-CM | POA: Diagnosis not present

## 2024-08-19 DIAGNOSIS — Z9889 Other specified postprocedural states: Secondary | ICD-10-CM | POA: Diagnosis not present

## 2024-08-19 DIAGNOSIS — I482 Chronic atrial fibrillation, unspecified: Secondary | ICD-10-CM | POA: Diagnosis not present

## 2024-08-26 DIAGNOSIS — Z7901 Long term (current) use of anticoagulants: Secondary | ICD-10-CM | POA: Diagnosis not present

## 2024-08-26 DIAGNOSIS — I482 Chronic atrial fibrillation, unspecified: Secondary | ICD-10-CM | POA: Diagnosis not present

## 2024-08-26 DIAGNOSIS — I2510 Atherosclerotic heart disease of native coronary artery without angina pectoris: Secondary | ICD-10-CM | POA: Diagnosis not present

## 2024-09-01 DIAGNOSIS — I482 Chronic atrial fibrillation, unspecified: Secondary | ICD-10-CM | POA: Diagnosis not present

## 2024-09-01 DIAGNOSIS — I739 Peripheral vascular disease, unspecified: Secondary | ICD-10-CM | POA: Diagnosis not present

## 2024-09-01 DIAGNOSIS — J90 Pleural effusion, not elsewhere classified: Secondary | ICD-10-CM | POA: Diagnosis not present

## 2024-09-01 DIAGNOSIS — I714 Abdominal aortic aneurysm, without rupture, unspecified: Secondary | ICD-10-CM | POA: Diagnosis not present

## 2024-09-01 DIAGNOSIS — E785 Hyperlipidemia, unspecified: Secondary | ICD-10-CM | POA: Diagnosis not present

## 2024-09-01 DIAGNOSIS — I2510 Atherosclerotic heart disease of native coronary artery without angina pectoris: Secondary | ICD-10-CM | POA: Diagnosis not present

## 2024-09-01 DIAGNOSIS — I10 Essential (primary) hypertension: Secondary | ICD-10-CM | POA: Diagnosis not present

## 2024-09-01 DIAGNOSIS — Z9889 Other specified postprocedural states: Secondary | ICD-10-CM | POA: Diagnosis not present

## 2024-09-06 ENCOUNTER — Telehealth (INDEPENDENT_AMBULATORY_CARE_PROVIDER_SITE_OTHER): Payer: Self-pay

## 2024-09-06 DIAGNOSIS — I482 Chronic atrial fibrillation, unspecified: Secondary | ICD-10-CM | POA: Diagnosis not present

## 2024-09-06 DIAGNOSIS — I10 Essential (primary) hypertension: Secondary | ICD-10-CM | POA: Diagnosis not present

## 2024-09-06 DIAGNOSIS — J069 Acute upper respiratory infection, unspecified: Secondary | ICD-10-CM | POA: Diagnosis not present

## 2024-09-06 DIAGNOSIS — I714 Abdominal aortic aneurysm, without rupture, unspecified: Secondary | ICD-10-CM | POA: Diagnosis not present

## 2024-09-06 NOTE — Telephone Encounter (Signed)
 Spoke with the patient and he is scheduled with Dr. Marea for a Endoleak angio on 09/13/24 with a 9:30 am arrival time to the [facility]. Pre-procedure instructions were discussed and will be sent to Mychart and mailed.

## 2024-09-06 NOTE — Telephone Encounter (Signed)
 I attempted to contact the patient to schedule him for a endoleak angio with Dr. Marea. A message was left for a return call.

## 2024-09-13 ENCOUNTER — Other Ambulatory Visit: Payer: Self-pay

## 2024-09-13 ENCOUNTER — Encounter: Payer: Self-pay | Admitting: Vascular Surgery

## 2024-09-13 ENCOUNTER — Ambulatory Visit
Admission: RE | Admit: 2024-09-13 | Discharge: 2024-09-13 | Disposition: A | Discharge status: Home / Self Care | Attending: Vascular Surgery | Admitting: Vascular Surgery

## 2024-09-13 ENCOUNTER — Encounter
Admission: RE | Disposition: A | Discharge status: Home / Self Care | Payer: Self-pay | Source: Home / Self Care | Attending: Vascular Surgery

## 2024-09-13 DIAGNOSIS — Z7901 Long term (current) use of anticoagulants: Secondary | ICD-10-CM | POA: Insufficient documentation

## 2024-09-13 DIAGNOSIS — I714 Abdominal aortic aneurysm, without rupture, unspecified: Secondary | ICD-10-CM | POA: Insufficient documentation

## 2024-09-13 DIAGNOSIS — I10 Essential (primary) hypertension: Secondary | ICD-10-CM | POA: Insufficient documentation

## 2024-09-13 DIAGNOSIS — E785 Hyperlipidemia, unspecified: Secondary | ICD-10-CM | POA: Diagnosis not present

## 2024-09-13 DIAGNOSIS — Z9889 Other specified postprocedural states: Secondary | ICD-10-CM

## 2024-09-13 DIAGNOSIS — I70229 Atherosclerosis of native arteries of extremities with rest pain, unspecified extremity: Secondary | ICD-10-CM

## 2024-09-13 DIAGNOSIS — T82330A Leakage of aortic (bifurcation) graft (replacement), initial encounter: Secondary | ICD-10-CM | POA: Diagnosis not present

## 2024-09-13 DIAGNOSIS — I9789 Other postprocedural complications and disorders of the circulatory system, not elsewhere classified: Secondary | ICD-10-CM | POA: Diagnosis not present

## 2024-09-13 DIAGNOSIS — I482 Chronic atrial fibrillation, unspecified: Secondary | ICD-10-CM | POA: Insufficient documentation

## 2024-09-13 DIAGNOSIS — Z79899 Other long term (current) drug therapy: Secondary | ICD-10-CM | POA: Insufficient documentation

## 2024-09-13 HISTORY — PX: ABDOMINAL AORTOGRAM: CATH118222

## 2024-09-13 HISTORY — PX: VENOUS ANGIOPLASTY: CATH118376

## 2024-09-13 HISTORY — PX: PELVIC ANGIOGRAPHY: CATH118254

## 2024-09-13 LAB — CREATININE, SERUM
Creatinine, Ser: 0.6 mg/dL — ABNORMAL LOW (ref 0.61–1.24)
GFR, Estimated: >60 mL/min (ref >60)

## 2024-09-13 LAB — PROTIME-INR
INR: 1.6 — ABNORMAL HIGH (ref 0.8–1.2)
Prothrombin Time: 20 s — ABNORMAL HIGH (ref 11.4–15.2)

## 2024-09-13 LAB — BUN: BUN: 17 mg/dL (ref 8–23)

## 2024-09-13 MED ORDER — MIDAZOLAM HCL (PF) 2 MG/2ML IJ SOLN
INTRAMUSCULAR | Status: DC | PRN
  Administered 2024-09-13 (×2): 1 mg via INTRAVENOUS

## 2024-09-13 MED ORDER — ONDANSETRON HCL 4 MG/2ML IJ SOLN: 4.0000 mg | Freq: Four times a day (QID) | INTRAMUSCULAR | Status: DC | PRN

## 2024-09-13 MED ORDER — FAMOTIDINE 20 MG PO TABS: 40.0000 mg | ORAL_TABLET | Freq: Once | ORAL | Status: DC | PRN

## 2024-09-13 MED ORDER — MIDAZOLAM HCL 5 MG/5ML IJ SOLN
INTRAMUSCULAR | Status: AC
  Filled 2024-09-13: qty 5

## 2024-09-13 MED ORDER — METHYLPREDNISOLONE SODIUM SUCC 125 MG IJ SOLR: 125.0000 mg | Freq: Once | INTRAMUSCULAR | Status: DC | PRN

## 2024-09-13 MED ORDER — FENTANYL CITRATE (PF) 100 MCG/2ML IJ SOLN
INTRAMUSCULAR | Status: AC
  Filled 2024-09-13: qty 2

## 2024-09-13 MED ORDER — FUROSEMIDE 10 MG/ML IJ SOLN
INTRAMUSCULAR | Status: AC
  Filled 2024-09-13: qty 2

## 2024-09-13 MED ORDER — IODIXANOL 320 MG/ML IV SOLN
INTRAVENOUS | Status: DC | PRN
Start: 2024-09-13 — End: 2024-09-13
  Administered 2024-09-13: 80 mL via INTRA_ARTERIAL

## 2024-09-13 MED ORDER — CEFAZOLIN SODIUM-DEXTROSE 2-4 GM/100ML-% IV SOLN
2.0000 g | INTRAVENOUS | Status: AC
  Administered 2024-09-13: 2 g via INTRAVENOUS

## 2024-09-13 MED ORDER — FUROSEMIDE 10 MG/ML IJ SOLN
20.0000 mg | Freq: Once | INTRAMUSCULAR | Status: AC
  Administered 2024-09-13: 20 mg via INTRAVENOUS

## 2024-09-13 MED ORDER — DIPHENHYDRAMINE HCL 50 MG/ML IJ SOLN: 50.0000 mg | Freq: Once | INTRAMUSCULAR | Status: DC | PRN

## 2024-09-13 MED ORDER — HEPARIN SODIUM (PORCINE) 1000 UNIT/ML IJ SOLN
INTRAMUSCULAR | Status: DC | PRN
Start: 2024-09-13 — End: 2024-09-13
  Administered 2024-09-13: 3000 [IU] via INTRAVENOUS

## 2024-09-13 MED ORDER — SODIUM CHLORIDE 0.9 % IV SOLN: INTRAVENOUS | Status: DC

## 2024-09-13 MED ORDER — LIDOCAINE-EPINEPHRINE (PF) 1 %-1:200000 IJ SOLN
INTRAMUSCULAR | Status: DC | PRN
  Administered 2024-09-13: 10 mL via INTRADERMAL

## 2024-09-13 MED ORDER — FENTANYL CITRATE (PF) 100 MCG/2ML IJ SOLN
INTRAMUSCULAR | Status: DC | PRN
  Administered 2024-09-13: 25 ug via INTRAVENOUS
  Administered 2024-09-13: 50 ug via INTRAVENOUS

## 2024-09-13 MED ORDER — HEPARIN (PORCINE) IN NACL 1000-0.9 UT/500ML-% IV SOLN
INTRAVENOUS | Status: DC | PRN
  Administered 2024-09-13: 1000 mL

## 2024-09-13 MED ORDER — HYDROMORPHONE HCL 1 MG/ML IJ SOLN: 1.0000 mg | Freq: Once | INTRAMUSCULAR | Status: DC | PRN

## 2024-09-13 MED ORDER — HEPARIN SODIUM (PORCINE) 1000 UNIT/ML IJ SOLN
INTRAMUSCULAR | Status: AC
  Filled 2024-09-13: qty 10

## 2024-09-13 MED ORDER — CEFAZOLIN SODIUM-DEXTROSE 2-4 GM/100ML-% IV SOLN
INTRAVENOUS | Status: AC
  Filled 2024-09-13: qty 100

## 2024-09-13 MED ORDER — MIDAZOLAM HCL 2 MG/ML PO SYRP: 8.0000 mg | ORAL_SOLUTION | Freq: Once | ORAL | Status: DC | PRN

## 2024-09-13 NOTE — Op Note (Signed)
 [facility]  Percutaneous Study/Intervention Procedural Note   Date of Surgery: 09/13/2024  Surgeon(s): Selinda Gu   Assistants:none  Pre-operative Diagnosis: AAA with enlarging aortic sac and type II endoleak by duplex  Post-operative diagnosis:  Same  Procedure(s) Performed:             1.  Ultrasound guidance for vascular access left femoral artery             2.  Catheter placement into left internal iliac artery, right limb of endoprosthesis, and the SMA and transverse colonic branch             3.  Aortogram and selective imaging of the left internal iliac artery, the right limb of the bypass graft to evaluate the right internal iliac and right external iliac arteries, and selective imaging of the SMA and the transverse colonic branch of the SMA             4.  StarClose closure device left femoral artery  EBL: 10 cc  Contrast: 80 cc  Fluoro Time: 29.9 min  Moderate Conscious Sedation time: approximately 68 minutes using 2 mg of Versed  and 75 mcg of Fentanyl               Indications:  Patient is a 89 y.o. male with a history of an abdominal aortic aneurysm repair with growth of his aortic sac. The patient has noninvasive study showing aortic sac growth with appearance of a type II endoleak by duplex. The patient is brought in for angiography for further evaluation and potential treatment. Risks and benefits are discussed and informed consent is obtained  Procedure:  The patient was identified and appropriate procedural time out was performed.  The patient was then placed supine on the table and prepped and draped in the usual sterile fashion. Moderate conscious sedation was administered throughout the procedure with a face to face encounter with the patient throughout and with my supervision of the RN administering medicines and monitoring the patients vital signs and mental status throughout from the start of the procedure until the patient was taken to the  recovery room.   Ultrasound was used to evaluate the left common femoral artery.  It was patent .  A digital ultrasound image was acquired.  A Seldinger needle was used to access the left common femoral artery under direct ultrasound guidance and a permanent image was performed.  A 0.035 J wire was advanced without resistance and a 5Fr sheath was placed.  Pigtail catheter was placed into the aorta and an AP aortogram was performed. This demonstrated normal renal arteries.  The aortoiliac stent graft for aneurysm repair was widely patent without any obvious type I or III endoleak's.  On initial imaging, no apparent endoleak was seen so began selectively imaging to search for this. I then used a rim catheter and selectively cannulated the left internal iliac artery.  Multiple different obliques were performed and imaging of the left internal iliac artery showed normal flow through the left internal iliac artery with no internal iliac artery branches feeding the aneurysm sac.  The rim catheter was then used to hook the flow divider to image the right iliac system.  This demonstrated normal flow through the right limb of the aortobiiliac stent graft.  The right internal iliac artery had normal flow and it had multiple branches previously treated with coil embolization. I then elected to evaluate the SMA for possible filling of the sac through collaterals to  the IMA. A VS1 catheter was used to cannulate the SMA and selective imaging was performed.  The SMA had a very steep downward takeoff.  The transverse colonic branch did feed a large meandering mesenteric arcade toward the IMA.  It was difficult to see if this went into the sac on imaging of the SMA.  Tediously, I then used a Progreat microcatheter and selectively cannulated the transverse colonic branch which was very difficult due to the steep nature of the SMA and the V S1 catheter kept coming out with any forward tension.  Using a Progreat catheter, I could get  about 10 to 15 cm out the transverse colonic branch towards the splenic flexure but the stored energy was very significant and I could never get further down.  Imaging through this did appear to demonstrate flow to an IMA that did feed the aneurysm sac although it was difficult to discern how brisk this was.  We were never able to get out far enough to appropriately coil embolized this branch as the V S1 catheter kept coming back.  Multiple different attempts include using a rim catheter and a CXI catheter were used to try to gain purchase but even we got a supra core wire into the SMA catheters Pushing back because of the steep takeoff.  It was clear we are going to need to address this from an arm approach if we are going to have any success with embolization of this branch. I elected to terminate the procedure. The sheath was removed and StarClose closure device was deployed in the left femoral artery with excellent hemostatic result. The patient was taken to the recovery room in stable condition having tolerated the procedure well.  Findings:               Aortogram:  This demonstrated normal renal arteries.  The aortoiliac stent graft for aneurysm repair was widely patent without any obvious type I or III endoleak's.              Right lower Extremity: Normal flow through the right limb of the aortobiiliac stent graft.  The right internal iliac artery had normal flow and it had multiple branches previously treated with coil embolization.  No flow was seen into the aortic sac from branches of the right internal iliac artery.  Left internal iliac artery: Multiple different obliques were performed and imaging of the left internal iliac artery showed normal flow through the left internal iliac artery with no internal iliac artery branches feeding the aneurysm sac.    Disposition: Patient was taken to the recovery room in stable condition having tolerated the procedure well.  Complications: None  Selinda Gu 09/13/2024 11:48 AM     This note was created with Dragon Medical transcription system. Any errors in dictation are purely unintentional.

## 2024-09-13 NOTE — Progress Notes (Signed)
 Pt. With sl. Labored breathing with NRB mask on; Pt. Sat up. MD informed pt. Uses CPAP at night & uses Lasix prn. Orders received for Lasix 20 mg IV now; med. Given. Pt. In at bedside speaking with pt. , his son, and daughter in law. MD ok'd pt. Sitting up 30 min. Early.

## 2024-09-13 NOTE — H&P (View-Only) (Signed)
 [facility] Admission History & Physical  MRN : [id number]  Richard Wilkins is a 89 y.o. (June 29, 1935) male who presents with chief complaint of No chief complaint on file. SABRA  History of Present Illness: Patient presents for evaluation of his abdominal aortic aneurysm sac growth.  He has had a type II endoleak demonstrated on duplex and had aortic sac growth on the study earlier this year.  No clear aneurysm related symptoms such as back or abdominal pain or signs of peripheral embolization.  No current facility-administered medications for this encounter.    Past Medical History:  Diagnosis Date   Abdominal aortic aneurysm (AAA)    Arthritis    Atrial fibrillation (HCC)    Cancer (HCC)    Chronic back pain    Colon polyp    Constipation    Coronary artery disease    Elevated lipids    Hemorrhage    Hyperlipidemia    Hypertension    Localized swelling of both lower legs    Myocardial infarction (HCC)    2004 x 2 stents   Sleep apnea    CPAP    Past Surgical History:  Procedure Laterality Date   CARDIAC SURGERY     CATARACT EXTRACTION W/PHACO Right 12/27/2019   Procedure: CATARACT EXTRACTION PHACO AND INTRAOCULAR LENS PLACEMENT (IOC) RIGHT;  Surgeon: Myrna Cain Anes, MD;  Location: [facility];  Service: Ophthalmology;  Laterality: Right;  CDE 9.84 US  1:03.2   HEMORRHOID SURGERY     IRRIGATION AND DEBRIDEMENT HEMATOMA     LOWER EXTREMITY ANGIOGRAPHY N/A 04/18/2022   Procedure: Lower Extremity Angiography;  Surgeon: Marea Selinda RAMAN, MD;  Location: [facility] INVAS[facility] CV [facility];  Service: Cardiovascular;  Laterality: N/A;   PERIPHERAL VASCULAR CATHETERIZATION N/A 09/25/2016   Procedure: Endovascular Repair/Stent Graft;  Surgeon: Selinda RAMAN Marea, MD;  Location: [facility] INVAS[facility] CV LAB;  Service: Cardiovascular;  Laterality: N/A;     Social History   Tobacco Use   Smoking status: Former    Current packs/day: 0.00    Types: Cigarettes    Quit date:  11/11/2002    Years since quitting: 21.8   Smokeless tobacco: Never  Vaping Use   Vaping status: Never Used  Substance Use Topics   Alcohol  use: No   Drug use: No     Family History  Problem Relation Age of Onset   Cancer - Other Brother    Prostate cancer Neg Hx    Bladder Cancer Neg Hx    Kidney cancer Neg Hx     No Known Allergies   REVIEW OF SYSTEMS (Negative unless checked)   Constitutional: [] Weight loss  [] Fever  [] Chills Cardiac: [] Chest pain   [] Chest pressure   [x] Palpitations   [] Shortness of breath when laying flat   [] Shortness of breath at rest   [x] Shortness of breath with exertion. Vascular:  [] Pain in legs with walking   [] Pain in legs at rest   [] Pain in legs when laying flat   [] Claudication   [] Pain in feet when walking  [] Pain in feet at rest  [] Pain in feet when laying flat   [] History of DVT   [] Phlebitis   [x] Swelling in legs   [] Varicose veins   [] Non-healing ulcers Pulmonary:   [] Uses home oxygen   [] Productive cough   [] Hemoptysis   [] Wheeze  [] COPD   [] Asthma Neurologic:  [] Dizziness  [] Blackouts   [] Seizures   [] History of stroke   [] History of TIA  [] Aphasia   []   Temporary blindness   [] Dysphagia   [] Weakness or numbness in arms   [] Weakness or numbness in legs Musculoskeletal:  [x] Arthritis   [] Joint swelling   [x] Joint pain   [] Low back pain Hematologic:  [] Easy bruising  [] Easy bleeding   [] Hypercoagulable state   [] Anemic   Gastrointestinal:  [] Blood in stool   [] Vomiting blood  [x] Gastroesophageal reflux/heartburn   [] Abdominal pain Genitourinary:  [] Chronic kidney disease   [] Difficult urination  [] Frequent urination  [] Burning with urination   [] Hematuria Skin:  [] Rashes   [] Ulcers   [] Wounds Psychological:  [] History of anxiety   []  History of major depression.  Physical Examination  There were no vitals filed for this visit. There is no height or weight on file to calculate BMI. Gen: WD/WN, NAD Head: [place]/AT, No temporalis wasting.   Ear/Nose/Throat: Hearing grossly intact, nares w/o erythema or drainage, oropharynx w/o Erythema/Exudate,  Eyes: Conjunctiva clear, sclera non-icteric Neck: Trachea midline.  No JVD.  Pulmonary:  Good air movement, respirations not labored, no use of accessory muscles.  Cardiac: irregular Vascular:  Vessel Right Left  Radial Palpable Palpable       Musculoskeletal: M/S 5/5 throughout.  Extremities without ischemic changes.  No deformity or atrophy.  Neurologic: Sensation grossly intact in extremities.  Symmetrical.  Speech is fluent. Motor exam as listed above. Psychiatric: Judgment intact, Mood & affect appropriate for pt's clinical situation. Dermatologic: No rashes or ulcers noted.  No cellulitis or open wounds.      CBC Lab Results  Component Value Date   WBC 13.9 (H) 09/26/2016   HGB 13.8 09/26/2016   HCT 40.2 09/26/2016   MCV 97.2 09/26/2016   PLT 217 09/26/2016    BMET    Component Value Date/Time   NA 137 09/26/2016 0315   NA 140 10/21/2014 0535   K 4.0 09/26/2016 0315   K 3.7 10/21/2014 0535   CL 105 09/26/2016 0315   CL 107 10/21/2014 0535   CO2 27 09/26/2016 0315   CO2 28 10/21/2014 0535   GLUCOSE 134 (H) 09/26/2016 0315   GLUCOSE 101 (H) 10/21/2014 0535   BUN 13 04/18/2022 1007   BUN 13 10/21/2014 0535   CREATININE 0.46 (L) 04/18/2022 1007   CREATININE 0.64 10/21/2014 0535   CALCIUM 8.9 09/26/2016 0315   CALCIUM 8.3 (L) 10/21/2014 0535   GFRNONAA >60 04/18/2022 1007   GFRNONAA >60 10/21/2014 0535   GFRAA >60 09/26/2016 0315   GFRAA >60 10/21/2014 0535   CrCl cannot be calculated (Patient's most recent lab result is older than the maximum 21 days allowed.).  COAG Lab Results  Component Value Date   INR 1.5 (H) 04/18/2022   INR 1.21 09/26/2016   INR 1.31 09/25/2016    Radiology No results found.   Assessment/Plan AAA without rupture Patient has had an enlargement of his aortic sac with a type II endoleak.  This was seen earlier this  year and we discussed angiogram with coil embolization.  He has a litany of other medical issues that had to be dealt with but he is here today for his procedure.  Risks and benefits are discussed.  Atrial fibrillation, chronic (HCC) On anticoagulation which seems to make type II endoleaks more likely   Hypertension blood pressure control important in reducing the progression of atherosclerotic disease and aneurysmal growth. On appropriate oral medications.     Hyperlipidemia lipid control important in reducing the progression of atherosclerotic disease. Continue statin therapy   Selinda Gu, MD  09/13/2024 9:03  AM

## 2024-09-13 NOTE — H&P (Signed)
 [facility] Admission History & Physical  MRN : [id number]  Richard Wilkins is a 89 y.o. (June 29, 1935) male who presents with chief complaint of No chief complaint on file. SABRA  History of Present Illness: Patient presents for evaluation of his abdominal aortic aneurysm sac growth.  He has had a type II endoleak demonstrated on duplex and had aortic sac growth on the study earlier this year.  No clear aneurysm related symptoms such as back or abdominal pain or signs of peripheral embolization.  No current facility-administered medications for this encounter.    Past Medical History:  Diagnosis Date   Abdominal aortic aneurysm (AAA)    Arthritis    Atrial fibrillation (HCC)    Cancer (HCC)    Chronic back pain    Colon polyp    Constipation    Coronary artery disease    Elevated lipids    Hemorrhage    Hyperlipidemia    Hypertension    Localized swelling of both lower legs    Myocardial infarction (HCC)    2004 x 2 stents   Sleep apnea    CPAP    Past Surgical History:  Procedure Laterality Date   CARDIAC SURGERY     CATARACT EXTRACTION W/PHACO Right 12/27/2019   Procedure: CATARACT EXTRACTION PHACO AND INTRAOCULAR LENS PLACEMENT (IOC) RIGHT;  Surgeon: Myrna Cain Anes, MD;  Location: [facility];  Service: Ophthalmology;  Laterality: Right;  CDE 9.84 US  1:03.2   HEMORRHOID SURGERY     IRRIGATION AND DEBRIDEMENT HEMATOMA     LOWER EXTREMITY ANGIOGRAPHY N/A 04/18/2022   Procedure: Lower Extremity Angiography;  Surgeon: Marea Selinda RAMAN, MD;  Location: [facility];  Service: Cardiovascular;  Laterality: N/A;   PERIPHERAL VASCULAR CATHETERIZATION N/A 09/25/2016   Procedure: Endovascular Repair/Stent Graft;  Surgeon: Selinda RAMAN Marea, MD;  Location: [facility] IN[facility] CV [facility];  Service: Cardiovascular;  Laterality: N/A;     Social History   Tobacco Use   Smoking status: Former    Current packs/day: 0.00    Types: Cigarettes    Quit date:  11/11/2002    Years since quitting: 21.8   Smokeless tobacco: Never  Vaping Use   Vaping status: Never Used  Substance Use Topics   Alcohol  use: No   Drug use: No     Family History  Problem Relation Age of Onset   Cancer - Other Brother    Prostate cancer Neg Hx    Bladder Cancer Neg Hx    Kidney cancer Neg Hx     No Known Allergies   REVIEW OF SYSTEMS (Negative unless checked)   Constitutional: [] Weight loss  [] Fever  [] Chills Cardiac: [] Chest pain   [] Chest pressure   [x] Palpitations   [] Shortness of breath when laying flat   [] Shortness of breath at rest   [x] Shortness of breath with exertion. Vascular:  [] Pain in legs with walking   [] Pain in legs at rest   [] Pain in legs when laying flat   [] Claudication   [] Pain in feet when walking  [] Pain in feet at rest  [] Pain in feet when laying flat   [] History of DVT   [] Phlebitis   [x] Swelling in legs   [] Varicose veins   [] Non-healing ulcers Pulmonary:   [] Uses home oxygen   [] Productive cough   [] Hemoptysis   [] Wheeze  [] COPD   [] Asthma Neurologic:  [] Dizziness  [] Blackouts   [] Seizures   [] History of stroke   [] History of TIA  [] Aphasia   []   Temporary blindness   [] Dysphagia   [] Weakness or numbness in arms   [] Weakness or numbness in legs Musculoskeletal:  [x] Arthritis   [] Joint swelling   [x] Joint pain   [] Low back pain Hematologic:  [] Easy bruising  [] Easy bleeding   [] Hypercoagulable state   [] Anemic   Gastrointestinal:  [] Blood in stool   [] Vomiting blood  [x] Gastroesophageal reflux/heartburn   [] Abdominal pain Genitourinary:  [] Chronic kidney disease   [] Difficult urination  [] Frequent urination  [] Burning with urination   [] Hematuria Skin:  [] Rashes   [] Ulcers   [] Wounds Psychological:  [] History of anxiety   []  History of major depression.  Physical Examination  There were no vitals filed for this visit. There is no height or weight on file to calculate BMI. Gen: WD/WN, NAD Head: [place]/AT, No temporalis wasting.   Ear/Nose/Throat: Hearing grossly intact, nares w/o erythema or drainage, oropharynx w/o Erythema/Exudate,  Eyes: Conjunctiva clear, sclera non-icteric Neck: Trachea midline.  No JVD.  Pulmonary:  Good air movement, respirations not labored, no use of accessory muscles.  Cardiac: irregular Vascular:  Vessel Right Left  Radial Palpable Palpable       Musculoskeletal: M/S 5/5 throughout.  Extremities without ischemic changes.  No deformity or atrophy.  Neurologic: Sensation grossly intact in extremities.  Symmetrical.  Speech is fluent. Motor exam as listed above. Psychiatric: Judgment intact, Mood & affect appropriate for pt's clinical situation. Dermatologic: No rashes or ulcers noted.  No cellulitis or open wounds.      CBC Lab Results  Component Value Date   WBC 13.9 (H) 09/26/2016   HGB 13.8 09/26/2016   HCT 40.2 09/26/2016   MCV 97.2 09/26/2016   PLT 217 09/26/2016    BMET    Component Value Date/Time   NA 137 09/26/2016 0315   NA 140 10/21/2014 0535   K 4.0 09/26/2016 0315   K 3.7 10/21/2014 0535   CL 105 09/26/2016 0315   CL 107 10/21/2014 0535   CO2 27 09/26/2016 0315   CO2 28 10/21/2014 0535   GLUCOSE 134 (H) 09/26/2016 0315   GLUCOSE 101 (H) 10/21/2014 0535   BUN 13 04/18/2022 1007   BUN 13 10/21/2014 0535   CREATININE 0.46 (L) 04/18/2022 1007   CREATININE 0.64 10/21/2014 0535   CALCIUM 8.9 09/26/2016 0315   CALCIUM 8.3 (L) 10/21/2014 0535   GFRNONAA >60 04/18/2022 1007   GFRNONAA >60 10/21/2014 0535   GFRAA >60 09/26/2016 0315   GFRAA >60 10/21/2014 0535   CrCl cannot be calculated (Patient's most recent lab result is older than the maximum 21 days allowed.).  COAG Lab Results  Component Value Date   INR 1.5 (H) 04/18/2022   INR 1.21 09/26/2016   INR 1.31 09/25/2016    Radiology No results found.   Assessment/Plan AAA without rupture Patient has had an enlargement of his aortic sac with a type II endoleak.  This was seen earlier this  year and we discussed angiogram with coil embolization.  He has a litany of other medical issues that had to be dealt with but he is here today for his procedure.  Risks and benefits are discussed.  Atrial fibrillation, chronic (HCC) On anticoagulation which seems to make type II endoleaks more likely   Hypertension blood pressure control important in reducing the progression of atherosclerotic disease and aneurysmal growth. On appropriate oral medications.     Hyperlipidemia lipid control important in reducing the progression of atherosclerotic disease. Continue statin therapy   Selinda Gu, MD  09/13/2024 9:03  AM

## 2024-09-13 NOTE — Progress Notes (Signed)
 Pt. Sat up on side of bed: voided 2 x, total of 250 ml. Pt. Ate 1/2 sandwich and drank water. Son at bedside. Pt. And son informed MD office to call pt. To schedule procedure next week through the arm. Both persons verbalized understanding of conversation.

## 2024-09-14 ENCOUNTER — Telehealth (INDEPENDENT_AMBULATORY_CARE_PROVIDER_SITE_OTHER): Payer: Self-pay

## 2024-09-14 NOTE — Telephone Encounter (Signed)
 Spoke with the patient and he is scheduled with Dr. Marea for a Endoleak angio with a arm approach, on 09/20/24 with a 9:30 am arrival time to the [facility]. Pre-procedure instructions were discussed and will be sent to Mychart and mailed.

## 2024-09-20 ENCOUNTER — Encounter: Payer: Self-pay | Admitting: Vascular Surgery

## 2024-09-20 ENCOUNTER — Ambulatory Visit
Admission: RE | Admit: 2024-09-20 | Discharge: 2024-09-20 | Disposition: A | Discharge status: Home / Self Care | Attending: Vascular Surgery | Admitting: Vascular Surgery

## 2024-09-20 ENCOUNTER — Other Ambulatory Visit: Payer: Self-pay

## 2024-09-20 ENCOUNTER — Encounter
Admission: RE | Disposition: A | Discharge status: Home / Self Care | Payer: Self-pay | Source: Home / Self Care | Attending: Vascular Surgery

## 2024-09-20 DIAGNOSIS — I70229 Atherosclerosis of native arteries of extremities with rest pain, unspecified extremity: Secondary | ICD-10-CM

## 2024-09-20 DIAGNOSIS — Z7901 Long term (current) use of anticoagulants: Secondary | ICD-10-CM | POA: Insufficient documentation

## 2024-09-20 DIAGNOSIS — I9789 Other postprocedural complications and disorders of the circulatory system, not elsewhere classified: Secondary | ICD-10-CM | POA: Diagnosis not present

## 2024-09-20 DIAGNOSIS — Z9889 Other specified postprocedural states: Secondary | ICD-10-CM | POA: Diagnosis not present

## 2024-09-20 DIAGNOSIS — I482 Chronic atrial fibrillation, unspecified: Secondary | ICD-10-CM | POA: Insufficient documentation

## 2024-09-20 DIAGNOSIS — I714 Abdominal aortic aneurysm, without rupture, unspecified: Secondary | ICD-10-CM | POA: Diagnosis not present

## 2024-09-20 DIAGNOSIS — E785 Hyperlipidemia, unspecified: Secondary | ICD-10-CM | POA: Diagnosis not present

## 2024-09-20 DIAGNOSIS — Y832 Surgical operation with anastomosis, bypass or graft as the cause of abnormal reaction of the patient, or of later complication, without mention of misadventure at the time of the procedure: Secondary | ICD-10-CM | POA: Insufficient documentation

## 2024-09-20 DIAGNOSIS — T82330A Leakage of aortic (bifurcation) graft (replacement), initial encounter: Secondary | ICD-10-CM | POA: Diagnosis not present

## 2024-09-20 DIAGNOSIS — Z87891 Personal history of nicotine dependence: Secondary | ICD-10-CM | POA: Diagnosis not present

## 2024-09-20 DIAGNOSIS — I10 Essential (primary) hypertension: Secondary | ICD-10-CM | POA: Insufficient documentation

## 2024-09-20 HISTORY — PX: PELVIC ANGIOGRAPHY: CATH118254

## 2024-09-20 HISTORY — PX: VENOUS ANGIOPLASTY: CATH118376

## 2024-09-20 HISTORY — PX: ABDOMINAL AORTOGRAM: CATH118222

## 2024-09-20 LAB — BUN: BUN: 18 mg/dL (ref 8–23)

## 2024-09-20 LAB — CREATININE, SERUM
Creatinine, Ser: 0.59 mg/dL — ABNORMAL LOW (ref 0.61–1.24)
GFR, Estimated: >60 mL/min (ref >60)

## 2024-09-20 MED ORDER — CEFAZOLIN SODIUM-DEXTROSE 2-4 GM/100ML-% IV SOLN
INTRAVENOUS | Status: AC
  Filled 2024-09-20: qty 100

## 2024-09-20 MED ORDER — FENTANYL CITRATE (PF) 100 MCG/2ML IJ SOLN
INTRAMUSCULAR | Status: DC | PRN
  Administered 2024-09-20: 25 ug via INTRAVENOUS
  Administered 2024-09-20: 50 ug via INTRAVENOUS
  Administered 2024-09-20: 25 ug via INTRAVENOUS

## 2024-09-20 MED ORDER — HEPARIN SODIUM (PORCINE) 1000 UNIT/ML IJ SOLN
INTRAMUSCULAR | Status: DC | PRN
  Administered 2024-09-20: 4000 [IU] via INTRAVENOUS

## 2024-09-20 MED ORDER — HEPARIN (PORCINE) IN NACL 1000-0.9 UT/500ML-% IV SOLN
INTRAVENOUS | Status: DC | PRN
Start: 2024-09-20 — End: 2024-09-20
  Administered 2024-09-20: 1000 mL

## 2024-09-20 MED ORDER — METHYLPREDNISOLONE SODIUM SUCC 125 MG IJ SOLR: 125.0000 mg | Freq: Once | INTRAMUSCULAR | Status: DC | PRN

## 2024-09-20 MED ORDER — MIDAZOLAM HCL 5 MG/5ML IJ SOLN
INTRAMUSCULAR | Status: AC
  Filled 2024-09-20: qty 5

## 2024-09-20 MED ORDER — IODIXANOL 320 MG/ML IV SOLN
INTRAVENOUS | Status: DC | PRN
Start: 2024-09-20 — End: 2024-09-20
  Administered 2024-09-20: 50 mL

## 2024-09-20 MED ORDER — MIDAZOLAM HCL 2 MG/ML PO SYRP: 8.0000 mg | ORAL_SOLUTION | Freq: Once | ORAL | Status: DC | PRN

## 2024-09-20 MED ORDER — HYDROMORPHONE HCL 1 MG/ML IJ SOLN: 1.0000 mg | Freq: Once | INTRAMUSCULAR | Status: DC | PRN

## 2024-09-20 MED ORDER — SODIUM CHLORIDE 0.9 % IV SOLN: INTRAVENOUS | Status: DC

## 2024-09-20 MED ORDER — MIDAZOLAM HCL (PF) 2 MG/2ML IJ SOLN
INTRAMUSCULAR | Status: DC | PRN
  Administered 2024-09-20: .5 mg via INTRAVENOUS
  Administered 2024-09-20: 1 mg via INTRAVENOUS
  Administered 2024-09-20 (×2): .5 mg via INTRAVENOUS

## 2024-09-20 MED ORDER — HEPARIN SODIUM (PORCINE) 1000 UNIT/ML IJ SOLN
INTRAMUSCULAR | Status: AC
  Filled 2024-09-20: qty 10

## 2024-09-20 MED ORDER — FAMOTIDINE 20 MG PO TABS: 40.0000 mg | ORAL_TABLET | Freq: Once | ORAL | Status: DC | PRN

## 2024-09-20 MED ORDER — CEFAZOLIN SODIUM-DEXTROSE 2-4 GM/100ML-% IV SOLN
2.0000 g | INTRAVENOUS | Status: AC
  Administered 2024-09-20: 2 g via INTRAVENOUS

## 2024-09-20 MED ORDER — LIDOCAINE-EPINEPHRINE (PF) 1 %-1:200000 IJ SOLN
INTRAMUSCULAR | Status: DC | PRN
  Administered 2024-09-20: 10 mL

## 2024-09-20 MED ORDER — FENTANYL CITRATE (PF) 100 MCG/2ML IJ SOLN
INTRAMUSCULAR | Status: AC
  Filled 2024-09-20: qty 2

## 2024-09-20 MED ORDER — ONDANSETRON HCL 4 MG/2ML IJ SOLN: 4.0000 mg | Freq: Four times a day (QID) | INTRAMUSCULAR | Status: DC | PRN

## 2024-09-20 MED ORDER — DIPHENHYDRAMINE HCL 50 MG/ML IJ SOLN: 50.0000 mg | Freq: Once | INTRAMUSCULAR | Status: DC | PRN

## 2024-09-20 NOTE — Op Note (Signed)
 [facility]  Percutaneous Study/Intervention Procedural Note   Date of Surgery: 09/20/2024,12:00 PM  Surgeon: Selinda Gu, MD  Pre-operative Diagnosis: Type II endoleak with increasing aneurysm sac size; abdominal aortic aneurysm status post stent graft repair  Post-operative diagnosis:  Same  Procedure(s) Performed:  1.  Introduction catheter branches of the superior mesenteric artery connecting down to the inferior mesenteric artery through the transverse colonic and splenic for lecture branches, third order catheter placement  2.  Coil embolization of the IMA with a total of 4 Ruby coils  3.  Ultrasound guidance for vascular access left radial artery    Anesthesia: Conscious sedation was administered by the interventional radiology RN under my direct supervision.  2.5 mg IV Versed  plus 100 mcg fentanyl  were utilized. Continuous ECG, pulse oximetry and blood pressure was monitored throughout the entire procedure. Conscious sedation was administered for a total of 83 minutes.  Sheath: 5 Fr left radial artery  Contrast: 50 cc   Fluoroscopy Time: 23.4 minutes  Indications: The patient has been followed status post aneurysm stent graft placement.  Although initially his aneurysm sac decrease significantly it is now reexpanded on his most recent follow-up exam.  He has had a previous angiogram from the femoral approach and we did not see any aneurysm sac filling from the hypogastric arteries.  He has already undergone coil embolization of right hypogastric artery branches in the past.  The type II endoleak appeared to originate from an SMA branch which was the transverse colonic branch that then fed down across the splenic flexure and through meandering mesenteric artery down to the IMA.  This was unable to be treated from the femoral approach due to the steep angle.  We are coming back to treat this from a radial approach his aneurysm is now larger than it was prior to  repair and therefore intervention is indicated.  Risks and benefits of been reviewed all questions were answered and the patient has agreed to proceed with endovascular repair of the endoleak.   Procedure:  Richard Wilkins a 89 y.o. male who was identified and appropriate procedural time out was performed.  The patient was then placed supine on the table and prepped and draped in the usual sterile fashion.  Ultrasound was used to evaluate the left radial artery.  It was echolucent and pulsatile indicating it is patent .  An ultrasound image was acquired for the permanent record.  A micropuncture needle was used to access the left radial artery under direct ultrasound guidance.  The microwire was then advanced under fluoroscopic guidance without difficulty followed by the micro-sheath.  A 0.035 J wire was advanced without resistance and a 5Fr sheath was placed.    I then placed a pigtail catheter and a steep LAO projection was performed of the aorta to evaluate the origin of the SMA and the renal arteries.  I then used a select catheter to cannulate the SMA where selective imaging was performed.  We were able to identify the transverse colonic branch in a slight RAO projection.  I initially cannulated this with the select catheter and a lantern catheter but could not get stable purchase without a long sheath in place to help hold this.  I then replaced the advantage wire and placed a long 5 French sheath in the left radial artery that stopped just before the SMA.  I was then able to tediously note get the select catheter and a Progreat microcatheter out the transverse colonic  branch.  Selective imaging showed this to feed across the splenic flexure and then down meandering mesenteric vessels inferiorly to the IMA.  Tediously, I was able to get the long Progreat microcatheter well out the branch up across the splenic Frech flexure branch and then down to within about 8 to 10 cm of the aneurysm sac near the  origin of the IMA.  I then began performing coil embolization after selective imaging in this location.  A total of 4 coils are utilized.  The first 2 coils were 3 mm in diameter by 15 cm in length.  A 60 cm packing coil was then used.  Finally, a 4 mm diameter by 30 cm standard coil was used.  Imaging from more proximal demonstrates occlusion of the flow and near total resolution of the endoleak.  At this point, after successful coil embolization we elected to terminate the procedure.  The diagnostic catheters were removed and an 035 wire was replaced.  A short 5 French sheath was replaced and then a TR band was placed on the left wrist with excellent hemostatic result.   Findings:   Aortogram: Stent graft in good position no evidence of type I or type III endoleak.  SMA: Transverse colonic branch feeding the IMA through meandering mesenteric vessels across the splenic flexure creating a type II endoleak.        Disposition: Patient was taken to the recovery room in stable condition having tolerated the procedure well.  Richard Wilkins

## 2024-09-20 NOTE — Interval H&P Note (Signed)
 History and Physical Interval Note:  09/20/2024 8:58 AM  Richard Wilkins  has presented today for surgery, with the diagnosis of Endoleak Angio w arm approach   ASO w rest pain.  The various methods of treatment have been discussed with the patient and family. After consideration of risks, benefits and other options for treatment, the patient has consented to  Procedure(s): ABDOMINAL AORTOGRAM (N/A) PELVIC ANGIOGRAPHY (N/A) VENOUS ANGIOPLASTY (N/A) as a surgical intervention.  The patient's history has been reviewed, patient examined, no change in status, stable for surgery.  I have reviewed the patient's chart and labs.  Questions were answered to the patient's satisfaction.     [name]

## 2024-09-20 NOTE — Progress Notes (Signed)
 Patient arrived for procedure, prepped and ready at this time. Heat murmur auscultated that patient says he was unaware of, provider notified.

## 2024-09-21 ENCOUNTER — Encounter: Payer: Self-pay | Admitting: Vascular Surgery

## 2024-09-30 DIAGNOSIS — I482 Chronic atrial fibrillation, unspecified: Secondary | ICD-10-CM | POA: Diagnosis not present

## 2024-09-30 DIAGNOSIS — Z7901 Long term (current) use of anticoagulants: Secondary | ICD-10-CM | POA: Diagnosis not present

## 2024-10-12 ENCOUNTER — Other Ambulatory Visit (INDEPENDENT_AMBULATORY_CARE_PROVIDER_SITE_OTHER): Payer: Self-pay | Admitting: Vascular Surgery

## 2024-10-12 DIAGNOSIS — I9789 Other postprocedural complications and disorders of the circulatory system, not elsewhere classified: Secondary | ICD-10-CM

## 2024-10-12 DIAGNOSIS — I7143 Infrarenal abdominal aortic aneurysm, without rupture: Secondary | ICD-10-CM

## 2024-10-18 DIAGNOSIS — Z85828 Personal history of other malignant neoplasm of skin: Secondary | ICD-10-CM | POA: Diagnosis not present

## 2024-10-18 DIAGNOSIS — D2272 Melanocytic nevi of left lower limb, including hip: Secondary | ICD-10-CM | POA: Diagnosis not present

## 2024-10-18 DIAGNOSIS — L570 Actinic keratosis: Secondary | ICD-10-CM | POA: Diagnosis not present

## 2024-10-18 DIAGNOSIS — D225 Melanocytic nevi of trunk: Secondary | ICD-10-CM | POA: Diagnosis not present

## 2024-10-18 DIAGNOSIS — D2261 Melanocytic nevi of right upper limb, including shoulder: Secondary | ICD-10-CM | POA: Diagnosis not present

## 2024-10-18 DIAGNOSIS — M71331 Other bursal cyst, right wrist: Secondary | ICD-10-CM | POA: Diagnosis not present

## 2024-10-18 DIAGNOSIS — D2262 Melanocytic nevi of left upper limb, including shoulder: Secondary | ICD-10-CM | POA: Diagnosis not present

## 2024-10-19 ENCOUNTER — Other Ambulatory Visit (INDEPENDENT_AMBULATORY_CARE_PROVIDER_SITE_OTHER)

## 2024-10-19 ENCOUNTER — Ambulatory Visit (INDEPENDENT_AMBULATORY_CARE_PROVIDER_SITE_OTHER): Admitting: Vascular Surgery

## 2024-10-25 ENCOUNTER — Ambulatory Visit (INDEPENDENT_AMBULATORY_CARE_PROVIDER_SITE_OTHER): Admitting: Nurse Practitioner

## 2024-10-25 ENCOUNTER — Encounter (INDEPENDENT_AMBULATORY_CARE_PROVIDER_SITE_OTHER): Payer: Self-pay | Admitting: Nurse Practitioner

## 2024-10-25 ENCOUNTER — Other Ambulatory Visit (INDEPENDENT_AMBULATORY_CARE_PROVIDER_SITE_OTHER)

## 2024-10-25 VITALS — BP 120/79 | HR 83 | Resp 17 | Ht 70.0 in | Wt 229.0 lb

## 2024-10-25 DIAGNOSIS — I7143 Infrarenal abdominal aortic aneurysm, without rupture: Secondary | ICD-10-CM

## 2024-10-25 DIAGNOSIS — I9789 Other postprocedural complications and disorders of the circulatory system, not elsewhere classified: Secondary | ICD-10-CM | POA: Diagnosis not present

## 2024-10-25 NOTE — Progress Notes (Signed)
 Subjective:    Patient ID: Richard Wilkins, male    DOB: 1935/10/02, 89 y.o.   MRN: [id number] Chief Complaint  Patient presents with   Follow-up    4 weeks with EVAR duplex     HPI  Discussed the use of AI scribe software for clinical note transcription with the patient, who gave verbal consent to proceed.  History of Present Illness Richard Wilkins is an 89 year old male with a history of aneurysm repair who presents with black stool.  He has been experiencing black stool for the past four days.  He underwent an embolization procedure on November 10th to address a leak from a previously repaired aneurysm. This was his second operation related to this issue, with the first was diagnostic occurring on November 5th. The aneurysm was initially detected in May 2025 via ultrasound, but the repair was delayed until November due to scheduling issues.  He mentions soreness following the embolization procedures, particularly after the last intervention, which was more invasive. The soreness persisted for about ten days but has since resolved.    Results RADIOLOGY Abdominal Ultrasound (03/2024): Leak present, aneurysm size 6.7 cm Abdominal Ultrasound (10/25/2024): No leak, aneurysm size 6.9 cm   Review of Systems  Gastrointestinal:  Negative for abdominal pain.  All other systems reviewed and are negative.      Objective:   Physical Exam Vitals reviewed.  HENT:     Head: Normocephalic.  Cardiovascular:     Rate and Rhythm: Normal rate.     Pulses: Normal pulses.  Pulmonary:     Effort: Pulmonary effort is normal.  Skin:    General: Skin is warm and dry.  Neurological:     Mental Status: He is alert and oriented to person, place, and time.     Gait: Gait abnormal.  Psychiatric:        Mood and Affect: Mood normal.        Behavior: Behavior normal.        Thought Content: Thought content normal.        Judgment: Judgment normal.     Physical Exam    BP 120/79    Pulse 83   Resp 17   Ht 5' 10 (1.778 m)   Wt 229 lb (103.9 kg)   BMI 32.86 kg/m   Past Medical History:  Diagnosis Date   Abdominal aortic aneurysm (AAA)    Arthritis    Atrial fibrillation (HCC)    Cancer (HCC)    Chronic back pain    Colon polyp    Constipation    Coronary artery disease    Elevated lipids    Hemorrhage    Hyperlipidemia    Hypertension    Localized swelling of both lower legs    Myocardial infarction (HCC)    2004 x 2 stents   Sleep apnea    CPAP    Social History   Socioeconomic History   Marital status: Married    Spouse name: Not on file   Number of children: Not on file   Years of education: Not on file   Highest education level: Not on file  Occupational History   Occupation: retired  Tobacco Use   Smoking status: Former    Current packs/day: 0.00    Types: Cigarettes    Quit date: 11/11/2002    Years since quitting: 21.9   Smokeless tobacco: Never  Vaping Use   Vaping status: Never Used  Substance and Sexual  Activity   Alcohol  use: No   Drug use: No   Sexual activity: Not on file  Other Topics Concern   Not on file  Social History Narrative   Not on file   Social Drivers of Health   Tobacco Use: Medium Risk (10/25/2024)   Patient History    Smoking Tobacco Use: Former    Smokeless Tobacco Use: Never    Passive Exposure: Not on file  Financial Resource Strain: Low Risk  (07/05/2024)   Received from [facility]   Overall Financial Resource Strain (CARDIA)    Difficulty of Paying Living Expenses: Not hard at all  Food Insecurity: No Food Insecurity (07/05/2024)   Received from [facility]   Epic    Within the past 12 months, you worried that your food would run out before you got the money to buy more.: Never true    Within the past 12 months, the food you bought just didn't last and you didn't have money to get more.: Never true  Transportation Needs: No Transportation Needs (07/05/2024)    [facility] from [facility] - Transportation    In the past 12 months, has lack of transportation kept you from medical appointments or from getting medications?: No    Lack of Transportation (Non-Medical): No  Physical Activity: Not on file  Stress: Not on file  Social Connections: Not on file  Intimate Partner Violence: Not on file  Depression (EYV7-0): Not on file  Alcohol  Screen: Not on file  Housing: Low Risk  (07/05/2024)   Received from [facility]   Epic    In the last 12 months, was there a time when you were not able to pay the mortgage or rent on time?: No    In the past 12 months, how many times have you moved where you were living?: 0    At any time in the past 12 months, were you homeless or living in a shelter (including now)?: No  Utilities: Not At Risk (07/05/2024)   Received from [facility]   Epic    In the past 12 months has the electric, gas, oil, or water company threatened to shut off services in your home?: No  Health Literacy: Not on file    Past Surgical History:  Procedure Laterality Date   ABDOMINAL AORTOGRAM N/A 09/13/2024   Procedure: ABDOMINAL AORTOGRAM;  Surgeon: Marea Selinda RAMAN, MD;  Location: [facility] INV[facility]IVE [facility];  Service: Cardiovascular;  Laterality: N/A;   ABDOMINAL AORTOGRAM N/A 09/20/2024   Procedure: ABDOMINAL AORTOGRAM;  Surgeon: Marea Selinda RAMAN, MD;  Location: [facility];  Service: Cardiovascular;  Laterality: N/A;   CARDIAC SURGERY     CATARACT EXTRACTION W/PHACO Right 12/27/2019   Procedure: CATARACT EXTRACTION PHACO AND INTRAOCULAR LENS PLACEMENT (IOC) RIGHT;  Surgeon: Myrna Adine Anes, MD;  Location[facility];  Service: Ophthalmology;  Laterality: Right;  CDE 9.84 US  1:03.2   HEMORRHOID SURGERY     IRRIGATION AND DEBRIDEMENT HEMATOMA     LOWER EXTREMITY ANGIOGRAPHY N/A 04/18/2022   Procedure: Lower Extremity Angiography;  Surgeon: Marea Selinda RAMAN, MD;  Location: [facility]  INVASIVE CV LAB;  Service: Cardiovascular;  Laterality: N/A;   PELVIC ANGIOGRAPHY N/A 09/13/2024   Procedure: PELVIC ANGIOGRAPHY;  Surgeon: Marea Selinda RAMAN, MD;  Location: [facility];  Service: Cardiovascular;  Laterality: N/A;   PELVIC ANGIOGRAPHY N/A 09/20/2024   Procedure: PELVIC ANGIOGRAPHY;  Surgeon: Marea Selinda RAMAN, MD;  Location: [facility];  Service: Cardiovascular;  Laterality: N/A;   PERIPHERAL VASCULAR CATHETERIZATION N/A 09/25/2016   Procedure: Endovascular Repair/Stent Graft;  Surgeon: Selinda RAMAN Marea, MD;  Location: [facility];  Service: Cardiovascular;  Laterality: N/A;   VENOUS ANGIOPLASTY N/A 09/13/2024   Procedure: VENOUS ANGIOPLASTY;  Surgeon: Marea Selinda RAMAN, MD;  Location: [facility];  Service: Cardiovascular;  Laterality: N/A;   VENOUS ANGIOPLASTY N/A 09/20/2024   Procedure: VENOUS ANGIOPLASTY;  Surgeon: Marea Selinda RAMAN, MD;  Location: [facility];  Service: Cardiovascular;  Laterality: N/A;    Family History  Problem Relation Age of Onset   Cancer - Other Brother    Prostate cancer Neg Hx    Bladder Cancer Neg Hx    Kidney cancer Neg Hx     Allergies[1]     Latest Ref Rng & Units 09/26/2016    3:15 AM 09/25/2016    1:14 PM 08/09/2016    5:10 AM  CBC  WBC 3.8 - 10.6 K/uL 13.9  9.2  10.5   Hemoglobin 13.0 - 18.0 g/dL 86.1  85.4  84.8   Hematocrit 40.0 - 52.0 % 40.2  42.5  44.2   Platelets 150 - 440 K/uL 217  215  223       CMP     Component Value Date/Time   NA 137 09/26/2016 0315   NA 140 10/21/2014 0535   K 4.0 09/26/2016 0315   K 3.7 10/21/2014 0535   CL 105 09/26/2016 0315   CL 107 10/21/2014 0535   CO2 27 09/26/2016 0315   CO2 28 10/21/2014 0535   GLUCOSE 134 (H) 09/26/2016 0315   GLUCOSE 101 (H) 10/21/2014 0535   BUN 18 09/20/2024 0921   BUN 13 10/21/2014 0535   CREATININE 0.59 (L) 09/20/2024 0921   CREATININE 0.64 10/21/2014 0535   CALCIUM 8.9 09/26/2016 0315   CALCIUM 8.3 (L) 10/21/2014 0535   PROT 7.5  08/08/2016 2156   ALBUMIN 4.4 08/08/2016 2156   AST 21 08/08/2016 2156   ALT 20 08/08/2016 2156   ALKPHOS 41 08/08/2016 2156   BILITOT 1.4 (H) 08/08/2016 2156   GFRNONAA >60 09/20/2024 0921   GFRNONAA >60 10/21/2014 0535     No results found.     Assessment & Plan:   1. Infrarenal abdominal aortic aneurysm (AAA) without rupture (Primary) Infrarenal abdominal aortic aneurysm status post endovascular repair and embolization Status post endovascular repair and embolization. Recent ultrasound shows no leak, but aneurysm size increased from 6.7 cm to 6.9 cm. No immediate intervention required as leak resolved and size increase may not indicate failure. - Scheduled follow-up ultrasound in three months to monitor aneurysm size. - Advised to contact Dr. Fernande regarding black stool and potential blood in stool. - Hold aspirin  if there is concern for blood in stool. - VAS US  EVAR DUPLEX; Future   Assessment and Plan Assessment & Plan      Medications Ordered Prior to Encounter[2]  There are no Patient Instructions on file for this visit. Return in about 3 months (around 01/23/2025) for 3 months EVAR JD/APP.   Neidra Girvan E Cambryn Charters, NP      [1] No Known Allergies [2]  Current Outpatient Medications on File Prior to Visit  Medication Sig Dispense Refill   acetaminophen  (TYLENOL ) 500 MG tablet Take 500-1,000 mg by mouth every 6 (six) hours as needed (for pain.).      alfuzosin  (UROXATRAL ) 10  MG 24 hr tablet Take 1 tablet (10 mg total) by mouth daily with breakfast.     aspirin  EC 81 MG tablet Take 81 mg by mouth daily.     diltiazem  (CARDIZEM  CD) 180 MG 24 hr capsule TAKE ONE CAPSULE BY MOUTH ONCE DAILY     diphenhydramine -acetaminophen  (TYLENOL  PM) 25-500 MG TABS tablet Take 1 tablet by mouth at bedtime as needed.     fluticasone  (FLONASE ) 50 MCG/ACT nasal spray Place 1 spray into both nostrils daily as needed (for allergies.).      guaiFENesin -dextromethorphan  (ROBITUSSIN DM) 100-10  MG/5ML syrup Take 5 mLs by mouth at bedtime as needed for cough.     HYDROcodone -acetaminophen  (NORCO/VICODIN) 5-325 MG tablet Take 1 tablet by mouth every 6 (six) hours as needed (for lower back pain.). 20 tablet 0   ketoconazole (NIZORAL) 2 % cream Apply topically 2 (two) times daily as needed.     meclizine (ANTIVERT) 12.5 MG tablet TAKE 1 TO 2 TABLETS BY MOUTH THREE TIMES DAILY AS NEEDED FOR MOTION SICKNESS     Misc Natural Products (COLON CLEANSE PO) Take by mouth.     potassium chloride  (KLOR-CON ) 10 MEQ tablet Take 10 mEq by mouth.     simvastatin  (ZOCOR ) 40 MG tablet TAKE ONE TABLET BY MOUTH AT BEDTIME     traZODone (DESYREL) 50 MG tablet Take 1 tablet by mouth at bedtime as needed.     Vitamin E (VITAMIN E/D-ALPHA NATURAL) 268 MG (400 UNIT) CAPS Take 400 Units by mouth.     warfarin (COUMADIN ) 5 MG tablet Take 5 mg by mouth at bedtime. 2.5mg  on tuesday     furosemide  (LASIX ) 40 MG tablet Take 40 mg by mouth daily as needed.     No current facility-administered medications on file prior to visit.

## 2024-12-01 ENCOUNTER — Emergency Department: Payer: Self-pay

## 2024-12-01 ENCOUNTER — Emergency Department: Admission: EM | Admit: 2024-12-01 | Discharge: 2024-12-12 | Disposition: E | Payer: Self-pay

## 2024-12-01 DIAGNOSIS — D649 Anemia, unspecified: Secondary | ICD-10-CM | POA: Diagnosis not present

## 2024-12-01 DIAGNOSIS — J9601 Acute respiratory failure with hypoxia: Secondary | ICD-10-CM | POA: Insufficient documentation

## 2024-12-01 DIAGNOSIS — R7401 Elevation of levels of liver transaminase levels: Secondary | ICD-10-CM | POA: Diagnosis not present

## 2024-12-01 DIAGNOSIS — R4182 Altered mental status, unspecified: Secondary | ICD-10-CM | POA: Diagnosis not present

## 2024-12-01 DIAGNOSIS — R935 Abnormal findings on diagnostic imaging of other abdominal regions, including retroperitoneum: Secondary | ICD-10-CM | POA: Insufficient documentation

## 2024-12-01 DIAGNOSIS — I509 Heart failure, unspecified: Secondary | ICD-10-CM | POA: Insufficient documentation

## 2024-12-01 DIAGNOSIS — E876 Hypokalemia: Secondary | ICD-10-CM | POA: Insufficient documentation

## 2024-12-01 DIAGNOSIS — I7143 Infrarenal abdominal aortic aneurysm, without rupture: Secondary | ICD-10-CM | POA: Diagnosis not present

## 2024-12-01 DIAGNOSIS — I3139 Other pericardial effusion (noninflammatory): Secondary | ICD-10-CM | POA: Diagnosis not present

## 2024-12-01 DIAGNOSIS — J9602 Acute respiratory failure with hypercapnia: Secondary | ICD-10-CM | POA: Insufficient documentation

## 2024-12-01 DIAGNOSIS — R579 Shock, unspecified: Secondary | ICD-10-CM | POA: Insufficient documentation

## 2024-12-01 DIAGNOSIS — I214 Non-ST elevation (NSTEMI) myocardial infarction: Secondary | ICD-10-CM | POA: Diagnosis not present

## 2024-12-01 MED ADMIN — Sodium Chloride IV Soln 0.9%: 1000 mL | INTRAVENOUS | NDC 00264580200

## 2024-12-01 MED ADMIN — Vancomycin HCl IV Soln 1250 MG/250ML (Base Equivalent): 1250 mg | INTRAVENOUS | NDC 70594005701

## 2024-12-01 MED ADMIN — Piperacillin Sod-Tazobactam Sod in Dex IV Sol 3-0.375GM/50ML: 3.375 g | INTRAVENOUS | NDC 00338963601

## 2024-12-01 MED ADMIN — Propofol IV Emul 1000 MG/100ML (10 MG/ML): 75 ug/kg/min | INTRAVENOUS | NDC 00069024801

## 2024-12-01 MED ADMIN — Propofol IV Emul 1000 MG/100ML (10 MG/ML): 15 ug/kg/min | INTRAVENOUS | NDC 00069024801

## 2024-12-01 MED ADMIN — Norepinephrine-Dextrose IV Solution 4 MG/250ML-5%: 20 ug/min | INTRAVENOUS | NDC 70004077240

## 2024-12-01 MED ADMIN — Norepinephrine-Dextrose IV Solution 4 MG/250ML-5%: 20 ug/min | INTRAVENOUS | NDC 00338011220

## 2024-12-01 MED ADMIN — Iohexol IV Soln 350 MG/ML: 75 mL | INTRAVENOUS | NDC 00407141490

## 2024-12-01 MED ADMIN — Etomidate IV Soln 2 MG/ML: 10 mg | INTRAVENOUS | NDC 00409669501

## 2024-12-01 MED ADMIN — Morphine Sulfate-Sodium Chloride 0.9% IV Soln 100 MG/100ML: 5 mg/h | INTRAVENOUS | NDC 70092138036

## 2024-12-01 MED ADMIN — Rocuronium Bromide IV Soln Pref Syr 100 MG/10ML (10 MG/ML): 80 mg | INTRAVENOUS | NDC 99999070048

## 2024-12-01 MED ADMIN — Vancomycin HCl-Dextrose IV Soln 1 GM/200ML-5%: 1000 mg | INTRAVENOUS | NDC 00338355248

## 2024-12-04 LAB — CULTURE, BLOOD (ROUTINE X 2)

## 2024-12-06 LAB — CULTURE, BLOOD (ROUTINE X 2): Culture: NO GROWTH

## 2024-12-12 DEATH — deceased

## 2025-01-24 ENCOUNTER — Ambulatory Visit (INDEPENDENT_AMBULATORY_CARE_PROVIDER_SITE_OTHER): Admitting: Nurse Practitioner

## 2025-01-24 ENCOUNTER — Other Ambulatory Visit (INDEPENDENT_AMBULATORY_CARE_PROVIDER_SITE_OTHER)
